# Patient Record
Sex: Female | Born: 1965 | Race: White | Hispanic: No | Marital: Married | Smoking: Current every day smoker
Health system: Southern US, Community
[De-identification: ages and names within clinical notes are randomized; demographics above are authoritative.]

## PROBLEM LIST (undated history)

## (undated) DIAGNOSIS — R011 Cardiac murmur, unspecified: Secondary | ICD-10-CM

## (undated) DIAGNOSIS — C50919 Malignant neoplasm of unspecified site of unspecified female breast: Secondary | ICD-10-CM

## (undated) DIAGNOSIS — Z923 Personal history of irradiation: Secondary | ICD-10-CM

## (undated) DIAGNOSIS — K219 Gastro-esophageal reflux disease without esophagitis: Secondary | ICD-10-CM

## (undated) DIAGNOSIS — I341 Nonrheumatic mitral (valve) prolapse: Secondary | ICD-10-CM

## (undated) DIAGNOSIS — I10 Essential (primary) hypertension: Secondary | ICD-10-CM

## (undated) HISTORY — PX: WISDOM TOOTH EXTRACTION: SHX21

## (undated) HISTORY — DX: Essential (primary) hypertension: I10

## (undated) HISTORY — DX: Malignant neoplasm of unspecified site of unspecified female breast: C50.919

---

## 1991-05-12 HISTORY — PX: TUBAL LIGATION: SHX77

## 2007-04-23 ENCOUNTER — Emergency Department (HOSPITAL_COMMUNITY): Admission: EM | Admit: 2007-04-23 | Discharge: 2007-04-23 | Payer: Self-pay | Admitting: *Deleted

## 2017-05-04 ENCOUNTER — Other Ambulatory Visit: Payer: Self-pay

## 2017-05-04 ENCOUNTER — Emergency Department (HOSPITAL_COMMUNITY): Payer: Worker's Compensation

## 2017-05-04 ENCOUNTER — Encounter (HOSPITAL_COMMUNITY): Payer: Self-pay | Admitting: Emergency Medicine

## 2017-05-04 ENCOUNTER — Emergency Department (HOSPITAL_COMMUNITY)
Admission: EM | Admit: 2017-05-04 | Discharge: 2017-05-04 | Disposition: A | Discharge status: Home / Self Care | Payer: Worker's Compensation | Attending: Emergency Medicine | Admitting: Emergency Medicine

## 2017-05-04 DIAGNOSIS — Z23 Encounter for immunization: Secondary | ICD-10-CM | POA: Insufficient documentation

## 2017-05-04 DIAGNOSIS — F17210 Nicotine dependence, cigarettes, uncomplicated: Secondary | ICD-10-CM | POA: Insufficient documentation

## 2017-05-04 DIAGNOSIS — W269XXA Contact with unspecified sharp object(s), initial encounter: Secondary | ICD-10-CM | POA: Diagnosis not present

## 2017-05-04 DIAGNOSIS — Y93G1 Activity, food preparation and clean up: Secondary | ICD-10-CM | POA: Diagnosis not present

## 2017-05-04 DIAGNOSIS — S61212A Laceration without foreign body of right middle finger without damage to nail, initial encounter: Secondary | ICD-10-CM | POA: Diagnosis present

## 2017-05-04 DIAGNOSIS — Y92090 Kitchen in other non-institutional residence as the place of occurrence of the external cause: Secondary | ICD-10-CM | POA: Diagnosis not present

## 2017-05-04 DIAGNOSIS — Y990 Civilian activity done for income or pay: Secondary | ICD-10-CM | POA: Diagnosis not present

## 2017-05-04 HISTORY — DX: Nonrheumatic mitral (valve) prolapse: I34.1

## 2017-05-04 MED ORDER — TETANUS-DIPHTH-ACELL PERTUSSIS 5-2.5-18.5 LF-MCG/0.5 IM SUSP
0.5000 mL | Freq: Once | INTRAMUSCULAR | Status: AC
  Administered 2017-05-04: 0.5 mL via INTRAMUSCULAR
  Filled 2017-05-04: qty 0.5

## 2017-05-04 NOTE — Discharge Instructions (Signed)
Please read instructions below.  Keep your wound clean and covered.  Gently daily with soap and water.  You can apply antibiotic ointment, such as bacitracin or Neosporin and re-bandage.  Make sure to protect your hand with a rubber glove prior to doing any dishes at work. Follow up with your primary care as needed. Return to the ER for fever, pus draining from wound, redness, or new or worsening symptoms.

## 2017-05-04 NOTE — ED Provider Notes (Signed)
[facility] Provider Note   CSN: [id number] Arrival date & time: 05/04/17  1532     History   Chief Complaint Chief Complaint  Patient presents with  . Finger Injury    HPI Brenda Waters is a 51 y.o. female presenting to the ED with acute onset of right middle finger laceration that occurred prior to arrival.  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  She states that she put a glove away the dishes slammed on her finger and caused a cut.  She states she initially she had trouble getting the bleeding to stop the so her work center here to the ED to be cleared.  Tetanus status is unknown.  Patient states she has minimal pain associated.  Is not on anticoagulation.  Denies any immunocompromise.  No other complaints.  The history is provided by the patient.    Past Medical History:  Diagnosis Date  . Mitral valve prolapse     There are no active problems to display for this patient.   Past Surgical History:  Procedure Laterality Date  . CESAREAN SECTION      OB History    No data available       Home Medications    Prior to Admission medications   Medication Sig Start Date End Date Taking? Authorizing Provider  acetaminophen (TYLENOL) 325 MG tablet Take 650 mg by mouth every 6 (six) hours as needed.   Yes [provider]    Family History No family history on file.  Social History Social History   Tobacco Use  . Smoking status: Current Every Day Smoker    Packs/day: 1.00    Years: 37.00    Pack years: 37.00    Types: Cigarettes  . Smokeless tobacco: Never Used  Substance Use Topics  . Alcohol use: Yes    Alcohol/week: 2.4 oz    Types: 4 Cans of beer per week  . Drug use: No     Allergies   Patient has no known allergies.   Review of Systems Review of Systems  Musculoskeletal: Negative for joint swelling.  Skin: Positive for wound.      Physical Exam Updated Vital Signs BP (!) 164/112   Pulse 89   Temp 98.3 F (36.8 C) (Oral)   Resp 20   Ht 5\' 3"  (1.6 m)   Wt 68 kg (150 lb)   LMP  (Within Months)   SpO2 96%   BMI 26.57 kg/m   Physical Exam  Constitutional: She appears well-developed and well-nourished.  HENT:  Head: Normocephalic and atraumatic.  Eyes: Conjunctivae are normal.  Cardiovascular: Intact distal pulses.  Pulmonary/Chest: Effort normal.  Musculoskeletal:  Palmar aspect of right third finger over distal phalanx with superficial skin flap.  Not actively bleeding.  Not grossly contaminated.  Entire digit with full active extension and flexion.  No bony tenderness.  Normal sensation.  Psychiatric: She has a normal mood and affect. Her behavior is normal.  Nursing note and vitals reviewed.    ED Treatments / Results  Labs (all labs ordered are listed, but only abnormal results are displayed) Labs Reviewed - No data to display  EKG  EKG Interpretation None       Radiology Dg Finger Middle Right  Result Date: 05/04/2017 CLINICAL DATA:  Right middle finger laceration. EXAM: RIGHT MIDDLE FINGER 2+V COMPARISON:  None. FINDINGS: There is no  evidence of fracture or dislocation. There is no evidence of arthropathy or other focal bone abnormality. Soft tissues are unremarkable. IMPRESSION: Normal right middle finger. Electronically Signed   By: Marijo Conception, M.D.   On: 05/04/2017 16:35    Procedures Procedures (including critical care time)  Medications Ordered in ED Medications  Tdap (BOOSTRIX) injection 0.5 mL (0.5 mLs Intramuscular Given 05/04/17 1943)     Initial Impression / Assessment and Plan / ED Course  I have reviewed the triage vital signs and the nursing notes.  Pertinent labs & imaging results that were available during my care of the patient were reviewed by me and considered in my medical decision making (see chart for details).  Clinical Course as of May 04 2024    Tue May 04, 2017  2010 Pt without Ha, vision changes, Cp, SOB, or any other symptoms. Discussed importance to follow up with PCP and recheck Bp. States she has BP cuff at home and can check it there. Strict return precautions discussed.  [JR]    Clinical Course User Index [JR] Malyiah Fellows, [place] N, PA-C    Patient with superficial wound to pad of distal right third finger, does not require closure.  Normal range of motion.  X-ray negative.  N/V intact.  Tetanus updated.  Wound irrigated, and wound care done in ED with bacitracin applied.  Discussed wound care at home.  Safe for discharge.  Discussed results, findings, treatment and follow up. Patient advised of return precautions. Patient verbalized understanding and agreed with plan.  Final Clinical Impressions(s) / ED Diagnoses   Final diagnoses:  Laceration of right middle finger without foreign body without damage to nail, initial encounter    ED Discharge Orders    None       Marletta Bousquet, [place] N, PA-C 05/04/17 1950    Mosiah Bastin, [place] N, PA-C 05/04/17 2025    Duffy Bruce, MD 05/05/17 [phone]

## 2017-05-04 NOTE — ED Triage Notes (Signed)
Pt was putting up a rack of dishes when her glove caught on the rack and and the rack landed on her finger between a stainless steel sink. Pt reports substantial amount of bleeding due to laceration. Bleeding controlled in triage.

## 2021-03-04 ENCOUNTER — Other Ambulatory Visit: Payer: Self-pay | Admitting: Family Medicine

## 2021-03-04 DIAGNOSIS — N6320 Unspecified lump in the left breast, unspecified quadrant: Secondary | ICD-10-CM

## 2021-03-27 ENCOUNTER — Ambulatory Visit
Admission: RE | Admit: 2021-03-27 | Discharge: 2021-03-27 | Disposition: A | Discharge status: Home / Self Care | Payer: No Typology Code available for payment source | Source: Ambulatory Visit | Attending: Family Medicine | Admitting: Family Medicine

## 2021-03-27 ENCOUNTER — Other Ambulatory Visit: Payer: Self-pay

## 2021-03-27 ENCOUNTER — Other Ambulatory Visit: Payer: Self-pay | Admitting: Family Medicine

## 2021-03-27 DIAGNOSIS — N6320 Unspecified lump in the left breast, unspecified quadrant: Secondary | ICD-10-CM

## 2021-03-27 DIAGNOSIS — R921 Mammographic calcification found on diagnostic imaging of breast: Secondary | ICD-10-CM

## 2021-04-11 ENCOUNTER — Ambulatory Visit
Admission: RE | Admit: 2021-04-11 | Discharge: 2021-04-11 | Disposition: A | Discharge status: Home / Self Care | Payer: No Typology Code available for payment source | Source: Ambulatory Visit | Attending: Family Medicine | Admitting: Family Medicine

## 2021-04-11 DIAGNOSIS — N6320 Unspecified lump in the left breast, unspecified quadrant: Secondary | ICD-10-CM

## 2021-04-11 DIAGNOSIS — R921 Mammographic calcification found on diagnostic imaging of breast: Secondary | ICD-10-CM

## 2021-04-15 ENCOUNTER — Telehealth: Payer: Self-pay | Admitting: Hematology

## 2021-04-15 NOTE — Telephone Encounter (Signed)
Spoke to patient to confirm morning clinic appointment for 12/14, packet mailed to patient

## 2021-04-18 ENCOUNTER — Encounter: Payer: Self-pay | Admitting: *Deleted

## 2021-04-21 ENCOUNTER — Other Ambulatory Visit: Payer: Self-pay | Admitting: *Deleted

## 2021-04-21 DIAGNOSIS — Z170 Estrogen receptor positive status [ER+]: Secondary | ICD-10-CM

## 2021-04-21 DIAGNOSIS — C50412 Malignant neoplasm of upper-outer quadrant of left female breast: Secondary | ICD-10-CM | POA: Insufficient documentation

## 2021-04-22 NOTE — Progress Notes (Signed)
Radiation Oncology         ([phone]) [phone] ________________________________  Multidisciplinary Breast Oncology Clinic [facility]) Initial Outpatient Consultation  Name: Brenda Waters MRN: [id number]  Date: 04/23/2021  DOB: Jan 05, 1966  MM:CRFVOHK, No Pcp Per (Inactive)  Stark Klein, MD   REFERRING PHYSICIAN: Stark Klein, MD  DIAGNOSIS: The encounter diagnosis was Malignant neoplasm of upper-outer quadrant of left breast in female, estrogen receptor positive ([place]).  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 Malignant neoplasm of upper-outer quadrant of left breast in female, estrogen receptor positive ([place])  C50.412    Z17.0       HISTORY OF PRESENT ILLNESS::Brenda Waters is a 55 y.o. female who is presenting to the office today for evaluation of her newly diagnosed breast cancer. She is accompanied by her husband. She is doing well overall.   The patient presented with a palpable lump in the left breast. Subsequently, she underwent bilateral diagnostic mammography with tomography and left breast ultrasonography at [facility] on 03/27/21 showing a highly suspicious mass in the left breast at 12 o'clock, 4 cm from the nipple. A tiny satellite lesion was also appreciated 2 mm from the dominant mass, as well as a group of calcifications with associated density laterally and inferiorly to the mass. Calcifications were also seen between the density in the dominant mass as well.   Left breast biopsy at the 12 o'clock position, 4 cmfn, on 04/11/21 showed: grade 2 invasive mammary carcinoma measuring 1.5 cm in the greatest linear extent, and mammary carcinoma in-situ. Left breast UOQ biopsy also performed revealed/confirmed invasive mammary carcinoma. Prognostic indicators significant for: estrogen receptor, 80% positive and progesterone receptor, 70% positive, both with strong staining intensity.  Proliferation marker Ki67 at 15%. HER2 negative.  Menarche: 55 years old Age at first live birth: 55 years old GP: 2 LMP: in 2017 Contraceptive: yes, for about 5 years though dates are unspecified HRT: none   The patient was referred today for presentation in the multidisciplinary conference.  Radiology studies and pathology slides were presented there for review and discussion of treatment options.  A consensus was discussed regarding potential next steps.  PREVIOUS RADIATION THERAPY: No  PAST MEDICAL HISTORY:  Past Medical History:  Diagnosis Date   Breast cancer ([place])    Hypertension    Mitral valve prolapse     PAST SURGICAL HISTORY: Past Surgical History:  Procedure Laterality Date   CESAREAN SECTION      FAMILY HISTORY:  Family History  Problem Relation Age of Onset   Cancer Father 43       Oral   Breast cancer Paternal Grandmother    Lung cancer Paternal Building services engineer   Colon cancer Other    Colon cancer Paternal Great-grandmother    Colon cancer Paternal Great-grandfather    Leukemia Half-Brother     SOCIAL HISTORY:  Social History   Socioeconomic History   Marital status: Married    Spouse name: Not on file   Number of children: 2   Years of education: Not on file   Highest education level: Not on file  Occupational History   Not on file  Tobacco Use   Smoking status: Every Day    Packs/day: 1.00    Years: 37.00    Pack years: 37.00    Types: Cigarettes   Smokeless  tobacco: Never  Substance and Sexual Activity   Alcohol use: Yes    Alcohol/week: 4.0 standard drinks    Types: 4 Cans of beer per week    Comment: 30 years   Drug use: No   Sexual activity: Not on file  Other Topics Concern   Not on file  Social History Narrative   Not on file   Social Determinants of Health   Financial Resource Strain: Not on file  Food Insecurity: No Food Insecurity   Worried About Running Out of Food in the Last Year: Never true   Ran Out  of Food in the Last Year: Never true  Transportation Needs: No Transportation Needs   Lack of Transportation (Medical): No   Lack of Transportation (Non-Medical): No  Physical Activity: Not on file  Stress: Not on file  Social Connections: Not on file    ALLERGIES: No Known Allergies  MEDICATIONS:  Current Outpatient Medications  Medication Sig Dispense Refill   acetaminophen (TYLENOL) 325 MG tablet Take 650 mg by mouth every 6 (six) hours as needed.     Apple Cider Vinegar 500 MG TABS Take 450 mg by mouth.     ciclopirox (PENLAC) 8 % solution Apply 1 drop topically daily.     losartan-hydrochlorothiazide (HYZAAR) 50-12.5 MG tablet Take 1 tablet by mouth daily.     Multiple Vitamin (MULTIVITAMIN WITH MINERALS) TABS tablet Take 1 tablet by mouth daily.     No current facility-administered medications for this encounter.    REVIEW OF SYSTEMS: A 10+ POINT REVIEW OF SYSTEMS WAS OBTAINED including neurology, dermatology, psychiatry, cardiac, respiratory, lymph, extremities, GI, GU, musculoskeletal, constitutional, reproductive, HEENT. On the provided form, she reports a palpable breast lump. She denies any other symptoms.  She denies any pain within the breast area nipple discharge or bleeding prior to diagnosis.   PHYSICAL EXAM:    Vitals with BMI 04/23/2021  Height _0   Weight 131 lbs 3 oz  BMI 66.44  Systolic 034  Diastolic 77  Pulse 70    Lungs are clear to auscultation bilaterally. Heart has regular rate and rhythm. No palpable cervical, supraclavicular, or axillary adenopathy. Abdomen soft, non-tender, normal bowel sounds. Breast: Right breast with no palpable mass, nipple discharge, or bleeding. Left breast with a large palpable mass in the upper aspect of the breast measuring 4.5 cm in  greatest dimension. Some slight nipple retraction noted. When lying down, the mass protrudes above the breast tissue. No nipple discharge or bleeding. No skin involvement  KPS = 90  100 -  Normal; no complaints; no evidence of disease. 90   - Able to carry on normal activity; minor signs or symptoms of disease. 80   - Normal activity with effort; some signs or symptoms of disease. 4   - Cares for self; unable to carry on normal activity or to do active work. 60   - Requires occasional assistance, but is able to care for most of his personal needs. 50   - Requires considerable assistance and frequent medical care. 64   - Disabled; requires special care and assistance. 12   - Severely disabled; hospital admission is indicated although death not imminent. 53   - Very sick; hospital admission necessary; active supportive treatment necessary. 10   - Moribund; fatal processes progressing rapidly. 0     - Dead  Karnofsky DA, Abelmann WH, Craver LS and Burchenal [facility] [phone]) The use of the nitrogen mustards in the palliative treatment of carcinoma:  with particular reference to bronchogenic carcinoma Cancer 1 634-56  LABORATORY DATA:  Lab Results  Component Value Date   WBC 8.3 04/23/2021   HGB 14.4 04/23/2021   HCT 42.3 04/23/2021   MCV 89.8 04/23/2021   PLT 393 04/23/2021   Lab Results  Component Value Date   NA 143 04/23/2021   K 3.7 04/23/2021   CL 107 04/23/2021   CO2 26 04/23/2021   Lab Results  Component Value Date   ALT 22 04/23/2021   AST 17 04/23/2021   ALKPHOS 57 04/23/2021   BILITOT 0.9 04/23/2021    PULMONARY FUNCTION TEST:   Recent Review Flowsheet Data   There is no flowsheet data to display.     RADIOGRAPHY: US BREAST LTD UNI LEFT INC AXILLA  Result Date: 03/27/2021 CLINICAL DATA:  The patient presents with a palpable lump in the left breast. EXAM: DIGITAL DIAGNOSTIC BILATERAL MAMMOGRAM WITH TOMOSYNTHESIS AND CAD; ULTRASOUND LEFT BREAST LIMITED TECHNIQUE: Bilateral digital diagnostic mammography and breast tomosynthesis was performed. The images were evaluated with computer-aided detection.; Targeted ultrasound examination of the left breast was  performed. COMPARISON:  None. ACR Breast Density Category b: There are scattered areas of fibroglandular density. FINDINGS: No suspicious masses, calcifications, or distortion are identified in the right breast. There is a spiculated mass containing calcifications in the superior left breast measuring 3.4 cm mammographically. Just inferior and lateral to this mass is a group of calcifications with associated density. There are calcifications between the mass and the increased density as well. Including the calcifications, the mass, and the density, the total size is 4.4 cm. On physical exam, there is a lump in the left breast. Targeted ultrasound is performed, showing a dominant mass in the left breast at 12 o'clock, 4 cm from the nipple measuring 3.9 x 2.9 x 1.2 cm. There is an adjacent satellite lesion at 1 o'clock, 4 cm from the nipple measuring 7 x 8 x 6 mm. The dominant mass and the satellite lesion are only 2 mm apart and do not need to be biopsied separately. No adenopathy identified. The axillary image demonstrates a normal node with an adjacent vessel. IMPRESSION: Highly suspicious mass in the left breast at 12 o'clock, 4 cm from the nipple. Tiny satellite lesion 2 mm from the dominant mass. There is a group of calcifications with associated density lateral and inferior to the mass. There are calcifications between the density in the dominant mass as well. RECOMMENDATION: Recommend ultrasound-guided biopsy of the 12 o'clock left breast mass. Recommend stereotactic biopsy of the density containing calcifications just inferior and lateral to the dominant mass. I have discussed the findings and recommendations with the patient. If applicable, a reminder letter will be sent to the patient regarding the next appointment. BI-RADS CATEGORY  5: Highly suggestive of malignancy. Electronically Signed   By: Dorise Bullion III M.D.   On: 03/27/2021 12:24  MM DIAG BREAST TOMO BILATERAL  Result Date:  03/27/2021 CLINICAL DATA:  The patient presents with a palpable lump in the left breast. EXAM: DIGITAL DIAGNOSTIC BILATERAL MAMMOGRAM WITH TOMOSYNTHESIS AND CAD; ULTRASOUND LEFT BREAST LIMITED TECHNIQUE: Bilateral digital diagnostic mammography and breast tomosynthesis was performed. The images were evaluated with computer-aided detection.; Targeted ultrasound examination of the left breast was performed. COMPARISON:  None. ACR Breast Density Category b: There are scattered areas of fibroglandular density. FINDINGS: No suspicious masses, calcifications, or distortion are identified in the right breast. There is a spiculated mass containing calcifications in the superior left breast  measuring 3.4 cm mammographically. Just inferior and lateral to this mass is a group of calcifications with associated density. There are calcifications between the mass and the increased density as well. Including the calcifications, the mass, and the density, the total size is 4.4 cm. On physical exam, there is a lump in the left breast. Targeted ultrasound is performed, showing a dominant mass in the left breast at 12 o'clock, 4 cm from the nipple measuring 3.9 x 2.9 x 1.2 cm. There is an adjacent satellite lesion at 1 o'clock, 4 cm from the nipple measuring 7 x 8 x 6 mm. The dominant mass and the satellite lesion are only 2 mm apart and do not need to be biopsied separately. No adenopathy identified. The axillary image demonstrates a normal node with an adjacent vessel. IMPRESSION: Highly suspicious mass in the left breast at 12 o'clock, 4 cm from the nipple. Tiny satellite lesion 2 mm from the dominant mass. There is a group of calcifications with associated density lateral and inferior to the mass. There are calcifications between the density in the dominant mass as well. RECOMMENDATION: Recommend ultrasound-guided biopsy of the 12 o'clock left breast mass. Recommend stereotactic biopsy of the density containing calcifications just  inferior and lateral to the dominant mass. I have discussed the findings and recommendations with the patient. If applicable, a reminder letter will be sent to the patient regarding the next appointment. BI-RADS CATEGORY  5: Highly suggestive of malignancy. Electronically Signed   By: Dorise Bullion III M.D.   On: 03/27/2021 12:24  MM CLIP PLACEMENT LEFT  Result Date: 04/11/2021 CLINICAL DATA:  Evaluate post biopsy marker clip placements following ultrasound-guided and stereotactic core needle biopsies of the left breast. EXAM: 3D DIAGNOSTIC LEFT MAMMOGRAM POST ULTRASOUND AND STEREOTACTIC BIOPSIES COMPARISON:  Previous exam(s). FINDINGS: 3D Mammographic images were obtained following ultrasound guided and stereotactic guided biopsies of the left breast. The ribbon shaped biopsy clip lies within the upper breast mass. The X shaped biopsy clip lies 2.2 cm lateral to the mass, in the area of the asymmetry, with a few adjacent residual calcifications. IMPRESSION: Appropriate positioning of the ribbon shaped and X shaped biopsy marking clip at the site of biopsy in the upper left breast mass and adjacent asymmetry respectively. Final Assessment: Post Procedure Mammograms for Marker Placement Electronically Signed   By: Lajean Manes M.D.   On: 04/11/2021 09:17  MM LT BREAST BX W LOC DEV 1ST LESION IMAGE BX SPEC STEREO GUIDE  Addendum Date: 04/15/2021   ADDENDUM REPORT: 04/15/2021 16:03 ADDENDUM: 1- Pathology revealed GRADE II INVASIVE MAMMARY CARCINOMA- MAMMARY CARCINOMA IN SITU of the LEFT breast, 12 o'clock, 4cmfn (ribbon clip). This was found to be concordant by Dr. Lajean Manes. 2- Pathology revealed GRADE II INVASIVE MAMMARY CARCINOMA of the LEFT breast, upper outer quadrant (X clip). This was found to be concordant by Dr. Lajean Manes. Note: 1. and 2. E-cadherin is NEGATIVE supporting lobular origin. 2. There are microcalcifications associated with benign breast tissue (invasive carcinoma) in the biopsy  material. Pathology results were discussed with the patient by telephone. The patient reported doing well after the biopsies with tenderness at the sites. Post biopsy instructions and care were reviewed and questions were answered. The patient was encouraged to call [facility] for any additional concerns. The patient was referred to [facility] at [facility] on April 23, 2021. Pathology results reported by Stacie Acres RN on 04/15/2021.  Electronically Signed   By: Lajean Manes M.D.   On: 04/15/2021 16:03   Result Date: 04/15/2021 CLINICAL DATA:  Patient presents for ultrasound-guided core needle biopsy of a 3.9 cm highly suspicious left breast mass and stereotactic core needle biopsy an adjacent asymmetry with associated calcifications. EXAM: ULTRASOUND GUIDED LEFT BREAST CORE NEEDLE BIOPSY: BIOPSY #1. STEREOTACTIC GUIDED LEFT BREAST CORE NEEDLE BIOPSY: BIOPSY #2. COMPARISON:  Previous exam(s). PROCEDURE: BIOPSY #1: 3.9 CM 12 O'CLOCK POSITION BREAST MASS. I met with the patient and we discussed the procedure of ultrasound-guided biopsy, including benefits and alternatives. We discussed the high likelihood of a successful procedure. We discussed the risks of the procedure, including infection, bleeding, tissue injury, clip migration, and inadequate sampling. Informed written consent was given. The usual time-out protocol was performed immediately prior to the procedure. Lesion quadrant: Upper outer quadrant, near 12 o'clock, 4 cm the nipple. Using sterile technique and 1% Lidocaine as local anesthetic, under direct ultrasound visualization, a 12 gauge spring-loaded device was used to perform biopsy of the 3.9 cm spiculated mass using an inferior approach. At the conclusion of the procedure a ribbon shaped tissue marker clip was deployed into the biopsy cavity. BIOPSY #2: 1.3 CM SPAN CALCIFICATIONS AND ASSOCIATED ASYMMETRY.  The patient and I discussed the procedure of stereotactic-guided biopsy including benefits and alternatives. We discussed the high likelihood of a successful procedure. We discussed the risks of the procedure including infection, bleeding, tissue injury, clip migration, and inadequate sampling. Informed written consent was given. The usual time out protocol was performed immediately prior to the procedure. Using sterile technique and 1% Lidocaine as local anesthetic, under stereotactic guidance, a 9 gauge vacuum assisted device was used to perform core needle biopsy of calcifications and associated asymmetry lying 2 cm lateral and slightly inferior to the 12 o'clock position mass, using a superior approach. Specimen radiograph was performed showing several calcifications for which biopsy was performed. Specimens with calcifications are identified for pathology. Lesion quadrant: Upper outer quadrant At the conclusion of the procedure, an X shaped tissue marker clip was deployed into the biopsy cavity. Follow up 2 view mammogram was performed and dictated separately. IMPRESSION: Ultrasound guided biopsy of a left breast mass. Stereotactic guided biopsy left breast calcifications and asymmetry 2 cm lateral and slightly inferior to the left breast mass. No apparent complications. Electronically Signed: By: Lajean Manes M.D. On: 04/11/2021 09:12  [place] LT BREAST BX W LOC DEV 1ST LESION IMG BX SPEC US GUIDE  Addendum Date: 04/15/2021   ADDENDUM REPORT: 04/15/2021 16:03 ADDENDUM: 1- Pathology revealed GRADE II INVASIVE MAMMARY CARCINOMA- MAMMARY CARCINOMA IN SITU of the LEFT breast, 12 o'clock, 4cmfn (ribbon clip). This was found to be concordant by Dr. Lajean Manes. 2- Pathology revealed GRADE II INVASIVE MAMMARY CARCINOMA of the LEFT breast, upper outer quadrant (X clip). This was found to be concordant by Dr. Lajean Manes. Note: 1. and 2. E-cadherin is NEGATIVE supporting lobular origin. 2. There are  microcalcifications associated with benign breast tissue (invasive carcinoma) in the biopsy material. Pathology results were discussed with the patient by telephone. The patient reported doing well after the biopsies with tenderness at the sites. Post biopsy instructions and care were reviewed and questions were answered. The patient was encouraged to call [facility] for any additional concerns. The patient was referred to [facility] on April 23, 2021. Pathology results reported by Stacie Acres RN on 04/15/2021.  Electronically Signed   By: Lajean Manes M.D.   On: 04/15/2021 16:03   Result Date: 04/15/2021 CLINICAL DATA:  Patient presents for ultrasound-guided core needle biopsy of a 3.9 cm highly suspicious left breast mass and stereotactic core needle biopsy an adjacent asymmetry with associated calcifications. EXAM: ULTRASOUND GUIDED LEFT BREAST CORE NEEDLE BIOPSY: BIOPSY #1. STEREOTACTIC GUIDED LEFT BREAST CORE NEEDLE BIOPSY: BIOPSY #2. COMPARISON:  Previous exam(s). PROCEDURE: BIOPSY #1: 3.9 CM 12 O'CLOCK POSITION BREAST MASS. I met with the patient and we discussed the procedure of ultrasound-guided biopsy, including benefits and alternatives. We discussed the high likelihood of a successful procedure. We discussed the risks of the procedure, including infection, bleeding, tissue injury, clip migration, and inadequate sampling. Informed written consent was given. The usual time-out protocol was performed immediately prior to the procedure. Lesion quadrant: Upper outer quadrant, near 12 o'clock, 4 cm the nipple. Using sterile technique and 1% Lidocaine as local anesthetic, under direct ultrasound visualization, a 12 gauge spring-loaded device was used to perform biopsy of the 3.9 cm spiculated mass using an inferior approach. At the conclusion of the procedure a ribbon shaped tissue marker clip was  deployed into the biopsy cavity. BIOPSY #2: 1.3 CM SPAN CALCIFICATIONS AND ASSOCIATED ASYMMETRY. The patient and I discussed the procedure of stereotactic-guided biopsy including benefits and alternatives. We discussed the high likelihood of a successful procedure. We discussed the risks of the procedure including infection, bleeding, tissue injury, clip migration, and inadequate sampling. Informed written consent was given. The usual time out protocol was performed immediately prior to the procedure. Using sterile technique and 1% Lidocaine as local anesthetic, under stereotactic guidance, a 9 gauge vacuum assisted device was used to perform core needle biopsy of calcifications and associated asymmetry lying 2 cm lateral and slightly inferior to the 12 o'clock position mass, using a superior approach. Specimen radiograph was performed showing several calcifications for which biopsy was performed. Specimens with calcifications are identified for pathology. Lesion quadrant: Upper outer quadrant At the conclusion of the procedure, an X shaped tissue marker clip was deployed into the biopsy cavity. Follow up 2 view mammogram was performed and dictated separately. IMPRESSION: Ultrasound guided biopsy of a left breast mass. Stereotactic guided biopsy left breast calcifications and asymmetry 2 cm lateral and slightly inferior to the left breast mass. No apparent complications. Electronically Signed: By: Lajean Manes M.D. 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  She does have a large palpable mass in the breast, and with lumpectomy she may not have a good cosmetic result. We discussed the general course of radiation, potential side effects, and toxicities with radiation and the patient is interested in this  approach.  We also discussed indications for postmastectomy radiation therapy.  PLAN:  MRI  Plastic surgery consultation in the event of mastectomy Left lumpectomy with SLN (pending MRI) Oncotype  Adjuvant radiation therapy Aromatase Inhibitor    ------------------------------------------------  Blair Promise, PhD, MD  This document serves as a record of services personally performed by Gery Pray, MD. It was created on his behalf by Roney Mans, a trained medical scribe. The creation of this record is based on the scribe's personal observations and the provider's statements to them. This document has been checked and approved by the attending provider.

## 2021-04-22 NOTE — Progress Notes (Signed)
[place]   [phone] Fax:([phone] [place] Note   Patient Care Team: Patient, No Pcp Per (Inactive) as PCP - General (General Practice) Rockwell Germany, RN as Oncology Nurse Navigator Mauro Kaufmann, RN as Oncology Nurse Navigator Stark Klein, MD as Consulting Physician (General Surgery) Truitt Merle, MD as Consulting Physician (Hematology) Gery Pray, MD as Consulting Physician (Radiation Oncology)  Date of Service:  04/23/2021   CHIEF COMPLAINTS/PURPOSE OF CONSULTATION:  Left Breast Cancer, ER+  REFERRING PHYSICIAN:  [facility]   ASSESSMENT & PLAN:  Brenda Waters is a 55 y.o. postmenopausal female with a history of   1. 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 B/l diagnostic MM and left [place] on 03/27/21 showed 3.9 cm dominant mass at 12 o'clock and 8 mm adjacent satellite lesion at 1 o'clock, with associated calcifications. Biopsy 04/11/21 revealed invasive and in situ lobular carcinoma. --We discussed her imaging findings and the biopsy results in great details. -she will have breast MRI o evaluate the extent of her disease due to lobular histology, and see if she is a candidate for lumpectomy. She is agreeable with that. She was seen by Dr. Barry Dienes today and likely will proceed with surgery soon. She will likely need mastectomy -I recommend a Oncotype Dx test on the surgical sample and we'll make a decision about adjuvant chemotherapy based on the Oncotype result. Written material of this test was given to her. She is young and fit, would be a good candidate for chemotherapy if her Oncotype recurrence score is high. -If her surgical sentinel lymph node is positive, I recommend mammaprint for further risk stratification and guide adjuvant chemotherapy. -Giving the strong ER and PR  expression in her postmenopausal status, I recommend adjuvant endocrine therapy with aromatase inhibitor for a total of 10 years due to her lobular histology to reduce the risk of cancer recurrence. Potential benefits and side effects were discussed with patient and she is interested. -She was also seen by radiation oncologist Dr. Sondra Come today.  -She will likely benefit from breast radiation if she undergo lumpectomy to decrease the risk of breast cancer.  She was seen by Dr. Sondra Come today. -We also discussed the breast cancer surveillance after her surgery. She will continue annual screening mammogram, self exam, and a routine office visit with lab and exam with [place]. -I encouraged her to have healthy diet and exercise regularly.   2. Bone Health  -She has never had a DEXA. We will obtain one for baseline.  3. Social Support, lack of insurance  -she currently does not have insurance, notes she can't afford the insurance her job offers. We will have our social worker meet with her. -she continues to smoke and does not seem interested in quitting at this time. I recommended she try to at least cut back.   PLAN:  -breast MRI to evaluate the extent of her disease due to lobular histology -she will proceed with breast surgery soon, likely mastectomy and sentinel lymph node biopsy -Oncotype on her surgical sample, or MammaPrint if positive lymph node -I will see her after surgery or radiation.   Oncology History Overview Note   Cancer Staging  Malignant neoplasm of upper-outer quadrant of left breast in female, estrogen receptor positive ([place]) Staging form: Breast, AJCC 8th Edition - Clinical stage from 04/11/2021: Stage IB (cT2,  cN0, cM0, G2, ER+, PR+, HER2-) - Signed by Truitt Merle, MD on 04/22/2021    Malignant neoplasm of upper-outer quadrant of left breast in female, estrogen receptor positive ([place])  03/27/2021 Mammogram   EXAM: DIGITAL DIAGNOSTIC BILATERAL MAMMOGRAM WITH TOMOSYNTHESIS AND  CAD; ULTRASOUND LEFT BREAST LIMITED  IMPRESSION: Highly suspicious mass in the left breast at 12 o'clock, 4 cm from the nipple. Tiny satellite lesion 2 mm from the dominant mass. There is a group of calcifications with associated density lateral and inferior to the mass. There are calcifications between the density in the dominant mass as well.   04/11/2021 Cancer Staging   Staging form: Breast, AJCC 8th Edition - Clinical stage from 04/11/2021: Stage IB (cT2, cN0, cM0, G2, ER+, PR+, HER2-) - Signed by Truitt Merle, MD on 04/22/2021 Stage prefix: Initial diagnosis Histologic grading system: 3 grade system    04/11/2021 Initial Biopsy   Diagnosis 1. Breast, left, needle core biopsy, 12 o'clock, 4cmfn - INVASIVE MAMMARY CARCINOMA - MAMMARY CARCINOMA IN SITU - SEE COMMENT 2. Breast, left, needle core biopsy, upper outer quadrant - INVASIVE MAMMARY CARCINOMA - SEE COMMENT Microscopic Comment 1. and 2. The biopsy material shows an infiltrative proliferation of cells arranged linearly and in small clusters. Based on the biopsy, the carcinoma appears Nottingham grade 2 of 3 and measures 1.5 cm in greatest linear extent.  2. There are microcalcifications associated with benign breast tissue (invasive carcinoma) in the biopsy material.  1. and 2. E-cadherin is NEGATIVE supporting lobular origin.  1. PROGNOSTIC INDICATORS Results: The tumor cells are EQUIVOCAL for Her2 (2+). Her2 by FISH will be performed and results reported separately. Estrogen Receptor: 80%, POSITIVE, STRONG STAINING INTENSITY Progesterone Receptor: 70%, POSITIVE, STRONG STAINING INTENSITY Proliferation Marker Ki67: 15%  1. FLUORESCENCE IN-SITU HYBRIDIZATION Results: GROUP 5: HER2 **NEGATIVE**   04/21/2021 Initial Diagnosis   Malignant neoplasm of upper-outer quadrant of left breast in female, estrogen receptor positive ([place])      HISTORY OF PRESENTING ILLNESS:  Brenda Waters 55 y.o. female is a here because of  breast cancer. The patient was [facility] by [facility]. The patient presents to the clinic today accompanied by her husband Brenda Waters.   She presented with a palpable left breast lump, initially noticed in summer. She reports it doubled in size, prompting her to seek medical attention. She underwent bilateral diagnostic mammography and left breast ultrasonography on 03/27/21 showing: palpable 3.9 cm mass at 12 o'clock; 8 mm adjacent satellite lesion 2 mm from dominant mass; calcifications lateral and inferior to and within dominant mass; no adenopathy.  Biopsy on 04/11/21 showed: invasive and in situ mammary carcinoma, e-cadherin negative, grade 2. Invasive component present in both samples. Prognostic indicators significant for: estrogen receptor, 80% positive and progesterone receptor, 70% positive. Proliferation marker Ki67 at 15%. HER2 negative by FISH.   Today the patient notes they felt/feeling prior/after... -she denies any new pains or changes.  She has a PMHx of.... -HTN, on medication starting 02/2021  Socially... 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 -she reports cancer on her father's side-- breast cancer in her grandmother (received chemo), oral cancer in her father, colon cancer in a great aunt and two great-grandparents. -she is currently smoking a pack a day, for 37 years -she drinks 4 beers a week, "for my whole life"  GYN HISTORY  Menarchal: 55 years old LMP: ~55 years old Contraceptive: HRT:  GP:  2, first at age 89   REVIEW OF SYSTEMS:    Constitutional: Denies fevers, chills or abnormal night sweats Eyes: Denies blurriness of vision, double vision or watery eyes Ears, nose, mouth, throat, and face: Denies mucositis or sore throat Respiratory: Denies cough, dyspnea or wheezes Cardiovascular: Denies palpitation, chest discomfort or lower extremity swelling Gastrointestinal:  Denies nausea, heartburn or  change in bowel habits Skin: Denies abnormal skin rashes Lymphatics: Denies new lymphadenopathy or easy bruising Neurological:Denies numbness, tingling or new weaknesses Behavioral/Psych: Mood is stable, no new changes  All other systems were reviewed with the patient and are negative.   MEDICAL HISTORY:  Past Medical History:  Diagnosis Date   Breast cancer ([place])    Hypertension    Mitral valve prolapse     SURGICAL HISTORY: Past Surgical History:  Procedure Laterality Date   CESAREAN SECTION      SOCIAL HISTORY: Social History   Socioeconomic History   Marital status: Married    Spouse name: Not on file   Number of children: 2   Years of education: Not on file   Highest education level: Not on file  Occupational History   Not on file  Tobacco Use   Smoking status: Every Day    Packs/day: 1.00    Years: 37.00    Pack years: 37.00    Types: Cigarettes   Smokeless tobacco: Never  Substance and Sexual Activity   Alcohol use: Yes    Alcohol/week: 4.0 standard drinks    Types: 4 Cans of beer per week    Comment: 30 years   Drug use: No   Sexual activity: Not on file  Other Topics Concern   Not on file  Social History Narrative   Not on file   Social Determinants of Health   Financial Resource Strain: Not on file  Food Insecurity: No Food Insecurity   Worried About Running Out of Food in the Last Year: Never true   Ran Out of Food in the Last Year: Never true  Transportation Needs: No Transportation Needs   Lack of Transportation (Medical): No   Lack of Transportation (Non-Medical): No  Physical Activity: Not on file  Stress: Not on file  Social Connections: Not on file  Intimate Partner Violence: Not on file    FAMILY HISTORY: Family History  Problem Relation Age of Onset   Cancer Father 36       Oral   Breast cancer Paternal Grandmother    Lung cancer Paternal Uncle        firefighter   Colon cancer Other    Colon cancer Paternal  Great-grandmother    Colon cancer Paternal Great-grandfather    Leukemia Half-Brother     ALLERGIES:  has No Known Allergies.  MEDICATIONS:  Current Outpatient Medications  Medication Sig Dispense Refill   acetaminophen (TYLENOL) 325 MG tablet Take 650 mg by mouth every 6 (six) hours as needed.     Apple Cider Vinegar 500 MG TABS Take 450 mg by mouth.     ciclopirox (PENLAC) 8 % solution Apply 1 drop topically daily.     losartan-hydrochlorothiazide (HYZAAR) 50-12.5 MG tablet Take 1 tablet by mouth daily.     Multiple Vitamin (MULTIVITAMIN WITH MINERALS) TABS tablet Take 1 tablet by mouth daily.     No current facility-administered medications for this visit.    PHYSICAL EXAMINATION: ECOG PERFORMANCE STATUS: 0 - Asymptomatic  Vitals:   04/23/21 0917  BP: 134/77  Pulse: 70  Resp: 18  Temp: 97.9 F (36.6 C)  SpO2: 99%   Filed Weights   04/23/21 0917  Weight: 131 lb 3.2 oz (59.5 kg)    GENERAL:alert, no distress and comfortable SKIN: skin color, texture, turgor are normal, no rashes or significant lesions EYES: normal, Conjunctiva are pink and non-injected, sclera clear  NECK: supple, thyroid normal size, non-tender, without nodularity LYMPH:  no palpable lymphadenopathy in the cervical, axillary LUNGS: clear to auscultation and percussion with normal breathing effort HEART: regular rate & rhythm and no murmurs and no lower extremity edema ABDOMEN:abdomen soft, non-tender and normal bowel sounds Musculoskeletal:no cyanosis of digits and no clubbing  NEURO: alert & oriented x 3 with fluent speech, no focal motor/sensory deficits BREAST: 5 x 4 cm left breast mass. No palpable adenopathy bilaterally.   LABORATORY DATA:  I have reviewed the data as listed CBC Latest Ref Rng & Units 04/23/2021  WBC 4.0 - 10.5 K/uL 8.3  Hemoglobin 12.0 - 15.0 g/dL 14.4  Hematocrit 36.0 - 46.0 % 42.3  Platelets 150 - 400 K/uL 393    CMP Latest Ref Rng & Units 04/23/2021  Glucose 70 -  99 mg/dL 73  BUN 6 - 20 mg/dL 12  Creatinine 0.44 - 1.00 mg/dL 0.88  Sodium 135 - 145 mmol/L 143  Potassium 3.5 - 5.1 mmol/L 3.7  Chloride 98 - 111 mmol/L 107  CO2 22 - 32 mmol/L 26  Calcium 8.9 - 10.3 mg/dL 9.5  Total Protein 6.5 - 8.1 g/dL 7.2  Total Bilirubin 0.3 - 1.2 mg/dL 0.9  Alkaline Phos 38 - 126 U/L 57  AST 15 - 41 U/L 17  ALT 0 - 44 U/L 22     RADIOGRAPHIC STUDIES: I have personally reviewed the radiological images as listed and agreed with the findings in the report. US BREAST LTD UNI LEFT INC AXILLA  Result Date: 03/27/2021 CLINICAL DATA:  The patient presents with a palpable lump in the left breast. EXAM: DIGITAL DIAGNOSTIC BILATERAL MAMMOGRAM WITH TOMOSYNTHESIS AND CAD; ULTRASOUND LEFT BREAST LIMITED TECHNIQUE: Bilateral digital diagnostic mammography and breast tomosynthesis was performed. The images were evaluated with computer-aided detection.; Targeted ultrasound examination of the left breast was performed. COMPARISON:  None. ACR Breast Density Category b: There are scattered areas of fibroglandular density. FINDINGS: No suspicious masses, calcifications, or distortion are identified in the right breast. There is a spiculated mass containing calcifications in the superior left breast measuring 3.4 cm mammographically. Just inferior and lateral to this mass is a group of calcifications with associated density. There are calcifications between the mass and the increased density as well. Including the calcifications, the mass, and the density, the total size is 4.4 cm. On physical exam, there is a lump in the left breast. Targeted ultrasound is performed, showing a dominant mass in the left breast at 12 o'clock, 4 cm from the nipple measuring 3.9 x 2.9 x 1.2 cm. There is an adjacent satellite lesion at 1 o'clock, 4 cm from the nipple measuring 7 x 8 x 6 mm. The dominant mass and the satellite lesion are only 2 mm apart and do not need to be biopsied separately. No adenopathy  identified. The axillary image demonstrates a normal node with an adjacent vessel. IMPRESSION: Highly suspicious mass in the left breast at 12 o'clock, 4 cm from the nipple. Tiny satellite lesion 2 mm from the dominant mass. There is a group of calcifications with associated density lateral and inferior to the mass. There are calcifications between the density in the  dominant mass as well. RECOMMENDATION: Recommend ultrasound-guided biopsy of the 12 o'clock left breast mass. Recommend stereotactic biopsy of the density containing calcifications just inferior and lateral to the dominant mass. I have discussed the findings and recommendations with the patient. If applicable, a reminder letter will be sent to the patient regarding the next appointment. BI-RADS CATEGORY  5: Highly suggestive of malignancy. Electronically Signed   By: Dorise Bullion III M.D.   On: 03/27/2021 12:24  MM DIAG BREAST TOMO BILATERAL  Result Date: 03/27/2021 CLINICAL DATA:  The patient presents with a palpable lump in the left breast. EXAM: DIGITAL DIAGNOSTIC BILATERAL MAMMOGRAM WITH TOMOSYNTHESIS AND CAD; ULTRASOUND LEFT BREAST LIMITED TECHNIQUE: Bilateral digital diagnostic mammography and breast tomosynthesis was performed. The images were evaluated with computer-aided detection.; Targeted ultrasound examination of the left breast was performed. COMPARISON:  None. ACR Breast Density Category b: There are scattered areas of fibroglandular density. FINDINGS: No suspicious masses, calcifications, or distortion are identified in the right breast. There is a spiculated mass containing calcifications in the superior left breast measuring 3.4 cm mammographically. Just inferior and lateral to this mass is a group of calcifications with associated density. There are calcifications between the mass and the increased density as well. Including the calcifications, the mass, and the density, the total size is 4.4 cm. On physical exam, there is a  lump in the left breast. Targeted ultrasound is performed, showing a dominant mass in the left breast at 12 o'clock, 4 cm from the nipple measuring 3.9 x 2.9 x 1.2 cm. There is an adjacent satellite lesion at 1 o'clock, 4 cm from the nipple measuring 7 x 8 x 6 mm. The dominant mass and the satellite lesion are only 2 mm apart and do not need to be biopsied separately. No adenopathy identified. The axillary image demonstrates a normal node with an adjacent vessel. IMPRESSION: Highly suspicious mass in the left breast at 12 o'clock, 4 cm from the nipple. Tiny satellite lesion 2 mm from the dominant mass. There is a group of calcifications with associated density lateral and inferior to the mass. There are calcifications between the density in the dominant mass as well. RECOMMENDATION: Recommend ultrasound-guided biopsy of the 12 o'clock left breast mass. Recommend stereotactic biopsy of the density containing calcifications just inferior and lateral to the dominant mass. I have discussed the findings and recommendations with the patient. If applicable, a reminder letter will be sent to the patient regarding the next appointment. BI-RADS CATEGORY  5: Highly suggestive of malignancy. Electronically Signed   By: Dorise Bullion III M.D.   On: 03/27/2021 12:24  MM CLIP PLACEMENT LEFT  Result Date: 04/11/2021 CLINICAL DATA:  Evaluate post biopsy marker clip placements following ultrasound-guided and stereotactic core needle biopsies of the left breast. EXAM: 3D DIAGNOSTIC LEFT MAMMOGRAM POST ULTRASOUND AND STEREOTACTIC BIOPSIES COMPARISON:  Previous exam(s). FINDINGS: 3D Mammographic images were obtained following ultrasound guided and stereotactic guided biopsies of the left breast. The ribbon shaped biopsy clip lies within the upper breast mass. The X shaped biopsy clip lies 2.2 cm lateral to the mass, in the area of the asymmetry, with a few adjacent residual calcifications. IMPRESSION: Appropriate positioning of  the ribbon shaped and X shaped biopsy marking clip at the site of biopsy in the upper left breast mass and adjacent asymmetry respectively. Final Assessment: Post Procedure Mammograms for Marker Placement Electronically Signed   By: Lajean Manes M.D.   On: 04/11/2021 09:17  MM LT BREAST BX W  LOC DEV 1ST LESION IMAGE BX SPEC STEREO GUIDE  Addendum Date: 04/15/2021   ADDENDUM REPORT: 04/15/2021 16:03 ADDENDUM: 1- Pathology revealed GRADE II INVASIVE MAMMARY CARCINOMA- MAMMARY CARCINOMA IN SITU of the LEFT breast, 12 o'clock, 4cmfn (ribbon clip). This was found to be concordant by Dr. Lajean Manes. 2- Pathology revealed GRADE II INVASIVE MAMMARY CARCINOMA of the LEFT breast, upper outer quadrant (X clip). This was found to be concordant by Dr. Lajean Manes. Note: 1. and 2. E-cadherin is NEGATIVE supporting lobular origin. 2. There are microcalcifications associated with benign breast tissue (invasive carcinoma) in the biopsy material. Pathology results were discussed with the patient by telephone. The patient reported doing well after the biopsies with tenderness at the sites. Post biopsy instructions and care were reviewed and questions were answered. The patient was encouraged to call [facility] for any additional concerns. The patient was referred to [facility] at [facility] on April 23, 2021. Pathology results reported by Stacie Acres RN on 04/15/2021. Electronically Signed   By: Lajean Manes M.D.   On: 04/15/2021 16:03   Result Date: 04/15/2021 CLINICAL DATA:  Patient presents for ultrasound-guided core needle biopsy of a 3.9 cm highly suspicious left breast mass and stereotactic core needle biopsy an adjacent asymmetry with associated calcifications. EXAM: ULTRASOUND GUIDED LEFT BREAST CORE NEEDLE BIOPSY: BIOPSY #1. STEREOTACTIC GUIDED LEFT BREAST CORE NEEDLE BIOPSY: BIOPSY #2. COMPARISON:  Previous exam(s).  PROCEDURE: BIOPSY #1: 3.9 CM 12 O'CLOCK POSITION BREAST MASS. I met with the patient and we discussed the procedure of ultrasound-guided biopsy, including benefits and alternatives. We discussed the high likelihood of a successful procedure. We discussed the risks of the procedure, including infection, bleeding, tissue injury, clip migration, and inadequate sampling. Informed written consent was given. The usual time-out protocol was performed immediately prior to the procedure. Lesion quadrant: Upper outer quadrant, near 12 o'clock, 4 cm the nipple. Using sterile technique and 1% Lidocaine as local anesthetic, under direct ultrasound visualization, a 12 gauge spring-loaded device was used to perform biopsy of the 3.9 cm spiculated mass using an inferior approach. At the conclusion of the procedure a ribbon shaped tissue marker clip was deployed into the biopsy cavity. BIOPSY #2: 1.3 CM SPAN CALCIFICATIONS AND ASSOCIATED ASYMMETRY. The patient and I discussed the procedure of stereotactic-guided biopsy including benefits and alternatives. We discussed the high likelihood of a successful procedure. We discussed the risks of the procedure including infection, bleeding, tissue injury, clip migration, and inadequate sampling. Informed written consent was given. The usual time out protocol was performed immediately prior to the procedure. Using sterile technique and 1% Lidocaine as local anesthetic, under stereotactic guidance, a 9 gauge vacuum assisted device was used to perform core needle biopsy of calcifications and associated asymmetry lying 2 cm lateral and slightly inferior to the 12 o'clock position mass, using a superior approach. Specimen radiograph was performed showing several calcifications for which biopsy was performed. Specimens with calcifications are identified for pathology. Lesion quadrant: Upper outer quadrant At the conclusion of the procedure, an X shaped tissue marker clip was deployed into the  biopsy cavity. Follow up 2 view mammogram was performed and dictated separately. IMPRESSION: Ultrasound guided biopsy of a left breast mass. Stereotactic guided biopsy left breast calcifications and asymmetry 2 cm lateral and slightly inferior to the left breast mass. No apparent complications. Electronically Signed: By: Lajean Manes M.D. On: 04/11/2021 09:12  [place] LT BREAST BX W  LOC DEV 1ST LESION IMG BX SPEC US GUIDE  Addendum Date: 04/15/2021   ADDENDUM REPORT: 04/15/2021 16:03 ADDENDUM: 1- Pathology revealed GRADE II INVASIVE MAMMARY CARCINOMA- MAMMARY CARCINOMA IN SITU of the LEFT breast, 12 o'clock, 4cmfn (ribbon clip). This was found to be concordant by Dr. Lajean Manes. 2- Pathology revealed GRADE II INVASIVE MAMMARY CARCINOMA of the LEFT breast, upper outer quadrant (X clip). This was found to be concordant by Dr. Lajean Manes. Note: 1. and 2. E-cadherin is NEGATIVE supporting lobular origin. 2. There are microcalcifications associated with benign breast tissue (invasive carcinoma) in the biopsy material. Pathology results were discussed with the patient by telephone. The patient reported doing well after the biopsies with tenderness at the sites. Post biopsy instructions and care were reviewed and questions were answered. The patient was encouraged to call [facility] for any additional concerns. The patient was referred to [facility] at [facility] on April 23, 2021. Pathology results reported by Stacie Acres RN on 04/15/2021. Electronically Signed   By: Lajean Manes M.D.   On: 04/15/2021 16:03   Result Date: 04/15/2021 CLINICAL DATA:  Patient presents for ultrasound-guided core needle biopsy of a 3.9 cm highly suspicious left breast mass and stereotactic core needle biopsy an adjacent asymmetry with associated calcifications. EXAM: ULTRASOUND GUIDED LEFT BREAST CORE NEEDLE BIOPSY: BIOPSY #1. STEREOTACTIC  GUIDED LEFT BREAST CORE NEEDLE BIOPSY: BIOPSY #2. COMPARISON:  Previous exam(s). PROCEDURE: BIOPSY #1: 3.9 CM 12 O'CLOCK POSITION BREAST MASS. I met with the patient and we discussed the procedure of ultrasound-guided biopsy, including benefits and alternatives. We discussed the high likelihood of a successful procedure. We discussed the risks of the procedure, including infection, bleeding, tissue injury, clip migration, and inadequate sampling. Informed written consent was given. The usual time-out protocol was performed immediately prior to the procedure. Lesion quadrant: Upper outer quadrant, near 12 o'clock, 4 cm the nipple. Using sterile technique and 1% Lidocaine as local anesthetic, under direct ultrasound visualization, a 12 gauge spring-loaded device was used to perform biopsy of the 3.9 cm spiculated mass using an inferior approach. At the conclusion of the procedure a ribbon shaped tissue marker clip was deployed into the biopsy cavity. BIOPSY #2: 1.3 CM SPAN CALCIFICATIONS AND ASSOCIATED ASYMMETRY. The patient and I discussed the procedure of stereotactic-guided biopsy including benefits and alternatives. We discussed the high likelihood of a successful procedure. We discussed the risks of the procedure including infection, bleeding, tissue injury, clip migration, and inadequate sampling. Informed written consent was given. The usual time out protocol was performed immediately prior to the procedure. Using sterile technique and 1% Lidocaine as local anesthetic, under stereotactic guidance, a 9 gauge vacuum assisted device was used to perform core needle biopsy of calcifications and associated asymmetry lying 2 cm lateral and slightly inferior to the 12 o'clock position mass, using a superior approach. Specimen radiograph was performed showing several calcifications for which biopsy was performed. Specimens with calcifications are identified for pathology. Lesion quadrant: Upper outer quadrant At the  conclusion of the procedure, an X shaped tissue marker clip was deployed into the biopsy cavity. Follow up 2 view mammogram was performed and dictated separately. IMPRESSION: Ultrasound guided biopsy of a left breast mass. Stereotactic guided biopsy left breast calcifications and asymmetry 2 cm lateral and slightly inferior to the left breast mass. No apparent complications. Electronically Signed: By: Lajean Manes M.D. On: 04/11/2021 09:12    Orders Placed This  Encounter  Procedures   Ambulatory referral to Physical Therapy    Referral Priority:   Routine    Referral Type:   Physical Medicine    Referral Reason:   Specialty Services Required    Requested Specialty:   Physical Therapy    Number of Visits Requested:   1    All questions were answered. The patient knows to call the clinic with any problems, questions or concerns. The total time spent in the appointment was 50 minutes.     Truitt Merle, MD 04/23/2021 8:22 PM  I, Wilburn Mylar, am acting as scribe for Truitt Merle, MD.   I have reviewed the above documentation for accuracy and completeness, and I agree with the above.

## 2021-04-23 ENCOUNTER — Inpatient Hospital Stay: Payer: No Typology Code available for payment source | Admitting: Licensed Clinical Social Worker

## 2021-04-23 ENCOUNTER — Inpatient Hospital Stay: Payer: No Typology Code available for payment source | Attending: Hematology

## 2021-04-23 ENCOUNTER — Encounter: Payer: Self-pay | Admitting: *Deleted

## 2021-04-23 ENCOUNTER — Encounter: Payer: Self-pay | Admitting: Hematology

## 2021-04-23 ENCOUNTER — Encounter: Payer: Self-pay | Admitting: Genetic Counselor

## 2021-04-23 ENCOUNTER — Inpatient Hospital Stay (HOSPITAL_BASED_OUTPATIENT_CLINIC_OR_DEPARTMENT_OTHER): Payer: Self-pay | Admitting: Hematology

## 2021-04-23 ENCOUNTER — Other Ambulatory Visit: Payer: Self-pay | Admitting: General Surgery

## 2021-04-23 ENCOUNTER — Ambulatory Visit
Admission: RE | Admit: 2021-04-23 | Discharge: 2021-04-23 | Disposition: A | Discharge status: Home / Self Care | Payer: Self-pay | Source: Ambulatory Visit | Attending: Radiation Oncology | Admitting: Radiation Oncology

## 2021-04-23 ENCOUNTER — Other Ambulatory Visit: Payer: Self-pay | Admitting: *Deleted

## 2021-04-23 ENCOUNTER — Other Ambulatory Visit: Payer: Self-pay

## 2021-04-23 VITALS — BP 134/77 | HR 70 | Temp 97.9°F | Resp 18 | Ht 63.0 in | Wt 131.2 lb

## 2021-04-23 DIAGNOSIS — C50412 Malignant neoplasm of upper-outer quadrant of left female breast: Secondary | ICD-10-CM | POA: Insufficient documentation

## 2021-04-23 DIAGNOSIS — Z803 Family history of malignant neoplasm of breast: Secondary | ICD-10-CM

## 2021-04-23 DIAGNOSIS — Z170 Estrogen receptor positive status [ER+]: Secondary | ICD-10-CM

## 2021-04-23 DIAGNOSIS — Z1379 Encounter for other screening for genetic and chromosomal anomalies: Secondary | ICD-10-CM | POA: Insufficient documentation

## 2021-04-23 DIAGNOSIS — Z800 Family history of malignant neoplasm of digestive organs: Secondary | ICD-10-CM

## 2021-04-23 DIAGNOSIS — F17210 Nicotine dependence, cigarettes, uncomplicated: Secondary | ICD-10-CM

## 2021-04-23 DIAGNOSIS — I10 Essential (primary) hypertension: Secondary | ICD-10-CM

## 2021-04-23 DIAGNOSIS — Z801 Family history of malignant neoplasm of trachea, bronchus and lung: Secondary | ICD-10-CM

## 2021-04-23 DIAGNOSIS — I341 Nonrheumatic mitral (valve) prolapse: Secondary | ICD-10-CM

## 2021-04-23 LAB — CMP (CANCER CENTER ONLY)
ALT: 22 U/L (ref 0–44)
AST: 17 U/L (ref 15–41)
Albumin: 4.3 g/dL (ref 3.5–5.0)
Alkaline Phosphatase: 57 U/L (ref 38–126)
Anion gap: 10 (ref 5–15)
BUN: 12 mg/dL (ref 6–20)
CO2: 26 mmol/L (ref 22–32)
Calcium: 9.5 mg/dL (ref 8.9–10.3)
Chloride: 107 mmol/L (ref 98–111)
Creatinine: 0.88 mg/dL (ref 0.44–1.00)
GFR, Estimated: >60 mL/min (ref >60)
Glucose, Bld: 73 mg/dL (ref 70–99)
Potassium: 3.7 mmol/L (ref 3.5–5.1)
Sodium: 143 mmol/L (ref 135–145)
Total Bilirubin: 0.9 mg/dL (ref 0.3–1.2)
Total Protein: 7.2 g/dL (ref 6.5–8.1)

## 2021-04-23 LAB — CBC WITH DIFFERENTIAL (CANCER CENTER ONLY)
Abs Immature Granulocytes: 0.02 10*3/uL (ref 0.00–0.07)
Basophils Absolute: 0.1 10*3/uL (ref 0.0–0.1)
Basophils Relative: 1 %
Eosinophils Absolute: 0.1 10*3/uL (ref 0.0–0.5)
Eosinophils Relative: 2 %
HCT: 42.3 % (ref 36.0–46.0)
Hemoglobin: 14.4 g/dL (ref 12.0–15.0)
Immature Granulocytes: 0 %
Lymphocytes Relative: 28 %
Lymphs Abs: 2.3 10*3/uL (ref 0.7–4.0)
MCH: 30.6 pg (ref 26.0–34.0)
MCHC: 34 g/dL (ref 30.0–36.0)
MCV: 89.8 fL (ref 80.0–100.0)
Monocytes Absolute: 0.6 10*3/uL (ref 0.1–1.0)
Monocytes Relative: 7 %
Neutro Abs: 5.1 10*3/uL (ref 1.7–7.7)
Neutrophils Relative %: 62 %
Platelet Count: 393 10*3/uL (ref 150–400)
RBC: 4.71 MIL/uL (ref 3.87–5.11)
RDW: 12.8 % (ref 11.5–15.5)
WBC Count: 8.3 10*3/uL (ref 4.0–10.5)
nRBC: 0 % (ref 0.0–0.2)

## 2021-04-23 LAB — GENETIC SCREENING ORDER

## 2021-04-23 NOTE — Progress Notes (Signed)
[place] Work  Initial Assessment   Brenda Waters is a 55 y.o. year old female accompanied by spouse, Brenda Waters. Clinical Social Work was referred by [facility] for assessment of psychosocial needs.   SDOH (Social Determinants of Health) assessments performed: Yes SDOH Interventions    Flowsheet Row Most Recent Value  SDOH Interventions   Food Insecurity Interventions Intervention Not Indicated  Housing Interventions Intervention Not Indicated  Transportation Interventions Intervention Not Indicated       Distress Screen completed: Yes ONCBCN DISTRESS SCREENING 04/23/2021  Screening Type Initial Screening  Distress experienced in past week (1-10) 9  Practical problem type Housing;Work/school  Family Problem type Children  Emotional problem type Nervousness/Anxiety;Adjusting to illness;Isolation/feeling alone;Feeling hopeless      Family/Social Information:  Housing Arrangement: patient lives with husband. Son currently in the home but they are attempting to get him to move out . Daughter lives in [place] Family members/support persons in your life? Husband, daughter Transportation concerns: no  Employment: Working full time for [employer]. Stressful environment due to boss- hoping to stop working there. Income source: Employment Financial concerns:  potentially with treatment if she does not qualify for Medicaid Type of concern: Medical bills Food access concerns: no Services Currently in place:  n/a  Coping/ Adjustment to diagnosis: Patient understands treatment plan and what happens next? yes Concerns about diagnosis and/or treatment: How I will pay for the services I need Patient reported stressors: Children, Anxiety, and Adjusting to my illness Hopes and priorities: hopes to be treated and be well again and have less stress at home by getting son out of the home Patient enjoys gardening and cooking, watching sports, playing games on phone Current coping skills/  strengths: Capable of independent living , Motivation for treatment/growth , Special hobby/interest , and Supportive family/friends     SUMMARY: Current SDOH Barriers:  Financial constraints related to lack of insurance  Clinical Social Work Clinical Goal(s):  patient will work with [employer] team to address needs related to insurance  Interventions: Discussed common feeling and emotions when being diagnosed with cancer, and the importance of support during treatment Informed patient of the support team roles and support services at [facility] Provided [place] contact information and encouraged patient to call with any questions or concerns Provided patient with information about [facility]   Follow Up Plan: Patient will contact CSW with any support or resource needs Patient verbalizes understanding of plan: Yes    Christeen Douglas LCSW

## 2021-04-28 ENCOUNTER — Ambulatory Visit
Admission: RE | Admit: 2021-04-28 | Discharge: 2021-04-28 | Disposition: A | Discharge status: Home / Self Care | Payer: No Typology Code available for payment source | Source: Ambulatory Visit | Attending: General Surgery | Admitting: General Surgery

## 2021-04-28 ENCOUNTER — Other Ambulatory Visit: Payer: Self-pay

## 2021-04-28 DIAGNOSIS — Z170 Estrogen receptor positive status [ER+]: Secondary | ICD-10-CM

## 2021-04-28 MED ORDER — GADOBUTROL 1 MMOL/ML IV SOLN
7.0000 mL | Freq: Once | INTRAVENOUS | Status: AC | PRN
  Administered 2021-04-28: 11:00:00 7 mL via INTRAVENOUS

## 2021-04-29 ENCOUNTER — Telehealth: Payer: Self-pay | Admitting: *Deleted

## 2021-04-29 ENCOUNTER — Encounter: Payer: Self-pay | Admitting: *Deleted

## 2021-04-29 NOTE — Telephone Encounter (Signed)
Left vm regarding MRI results as well as f/u from [facility] from 12.14.22. Contact information provided for questions or needs.

## 2021-04-29 NOTE — Telephone Encounter (Signed)
Physician team notified of MRI results for review

## 2021-04-30 ENCOUNTER — Ambulatory Visit (INDEPENDENT_AMBULATORY_CARE_PROVIDER_SITE_OTHER): Payer: Self-pay | Admitting: Plastic Surgery

## 2021-04-30 ENCOUNTER — Other Ambulatory Visit: Payer: Self-pay

## 2021-04-30 ENCOUNTER — Encounter: Payer: Self-pay | Admitting: Plastic Surgery

## 2021-04-30 VITALS — BP 136/83 | HR 78 | Ht 62.0 in | Wt <= 1120 oz

## 2021-04-30 DIAGNOSIS — C50412 Malignant neoplasm of upper-outer quadrant of left female breast: Secondary | ICD-10-CM

## 2021-04-30 DIAGNOSIS — Z170 Estrogen receptor positive status [ER+]: Secondary | ICD-10-CM

## 2021-04-30 NOTE — Progress Notes (Signed)
Referring Provider Stark Klein, MD [address] [place],  [place] [address]   CC:  Chief Complaint  Patient presents with   consult      Brenda Waters is an 55 y.o. female.  HPI: Patient presents to discuss her options for breast reconstruction.  She was recently diagnosed with a palpable left-sided breast mass.  Imaging and subsequent biopsy confirmed 5.3 cm cancer.  She has been meeting with the cancer team and has not completely decided between mastectomy or lumpectomy.  It is expected that she might need postmastectomy radiation if she does go that route given the size of the lesion.  She is currently a daily smoker.  She currently does not have health insurance and this is a big factor for her in decision-making.  No Known Allergies  Outpatient Encounter Medications as of 04/30/2021  Medication Sig   acetaminophen (TYLENOL) 325 MG tablet Take 650 mg by mouth every 6 (six) hours as needed.   Apple Cider Vinegar 500 MG TABS Take 450 mg by mouth.   ciclopirox (PENLAC) 8 % solution Apply 1 drop topically daily.   losartan-hydrochlorothiazide (HYZAAR) 50-12.5 MG tablet Take 1 tablet by mouth daily.   Multiple Vitamin (MULTIVITAMIN WITH MINERALS) TABS tablet Take 1 tablet by mouth daily.   No facility-administered encounter medications on file as of 04/30/2021.     Past Medical History:  Diagnosis Date   Breast cancer ([place])    Hypertension    Mitral valve prolapse     Past Surgical History:  Procedure Laterality Date   CESAREAN SECTION      Family History  Problem Relation Age of Onset   Cancer Father 12       Oral   Breast cancer Paternal Grandmother    Lung cancer Paternal Uncle        firefighter   Colon cancer Other    Colon cancer Paternal Great-grandmother    Colon cancer Paternal Great-grandfather    Leukemia Half-Brother     Social History   Social History Narrative   Not on file     Review of Systems General: Denies fevers, chills,  weight loss CV: Denies chest pain, shortness of breath, palpitations  Physical Exam Vitals with BMI 04/30/2021 04/23/2021 05/04/2017  Height 5\' 2"  5\' 3"  -  Weight 10 lbs 131 lbs 3 oz -  BMI 7.98 92.11 -  Systolic 941 740 814  Diastolic 83 77 481  Pulse 78 70 -    General:  No acute distress,  Alert and oriented, Non-Toxic, Normal speech and affect Breast exam: Deferred  Assessment/Plan Had a long discussion with the patient about her options.  We discussed autologous reconstruction and implant-based reconstruction.  We discussed risks and benefits of both.  We discussed that implant-based reconstruction would be a staged procedure requiring switch to final implant from an expander.  I did explain to her that this could be performed as an immediate reconstruction.  If she does require postmastectomy radiation she would likely require latissimus flap and a tissue expander and potentially a contralateral procedure for symmetry.  I did also discussed with her that it is uncommon to do the breast reconstruction in the self-pay setting due to the cost but that if she wanted to move forward with that I would go that direction.  At the moment from a cost standpoint she is worried about paying further cancer treatment and prefers to focus on that for now.  After the cancer  is treated and everything settles out I am more than happy to see her and discuss delayed reconstruction.  Certainly if something changes with her insurance between now and the time of her surgery and that influences her decision then we can get her back in and reevaluate.  All of her questions were answered.  Cindra Presume 04/30/2021, 5:49 PM

## 2021-05-09 ENCOUNTER — Other Ambulatory Visit: Payer: Self-pay | Admitting: General Surgery

## 2021-05-15 ENCOUNTER — Encounter: Payer: Self-pay | Admitting: *Deleted

## 2021-05-19 ENCOUNTER — Encounter: Payer: Self-pay | Admitting: *Deleted

## 2021-06-06 NOTE — Progress Notes (Signed)
Surgical Instructions    Your procedure is scheduled on Thursday February 2nd.  Report [facility] Main Entr[facility]A" at 5:30 A.M., then check in with the Admitting office.  Call this number if you have problems the morning of surgery:  [phone]   If you have any questions prior to your surgery date call [phone]: Open Monday-Friday 8am-4pm    Remember:  Do not eat after midnight the night before your surgery  You may drink clear liquids until 4:30am the morning of your surgery.   Clear liquids allowed are: Water, Non-Citrus Juices (without pulp), Carbonated Beverages, Clear Tea, Black Coffee ONLY (NO MILK, CREAM OR POWDERED CREAMER of any kind), and Gatorade    Take these medicines the morning of surgery with A SIP OF WATER IF NEEDED  acetaminophen (TYLENOL) 325 MG tablet    As of today, STOP taking any Aspirin (unless otherwise instructed by your surgeon) Aleve, Naproxen, Ibuprofen, Motrin, Advil, Goody's, BC's, all herbal medications, fish oil, and all vitamins.  After your COVID test   You are not required to quarantine however you are required to wear a well-fitting mask when you are out and around people not in your household.  If your mask becomes wet or soiled, replace with a new one.  Wash your hands often with soap and water for 20 seconds or clean your hands with an alcohol-based hand sanitizer that contains at least 60% alcohol.  Do not share personal items.  Notify your provider: if you are in close contact with someone who has COVID  or if you develop a fever of 100.4 or greater, sneezing, cough, sore throat, shortness of breath or body aches.           Do not wear jewelry or makeup Do not wear lotions, powders, perfumes, or deodorant. Do not shave 48 hours prior to surgery.   Do not bring valuables to the hospital. DO Not wear nail polish, gel polish, artificial nails, or any other type of covering on natural nails (fingers and toes) If you have  artificial nails or gel coating that need to be removed by a nail salon, please have this removed prior to surgery. Artificial nails or gel coating may interfere with anesthesia's ability to adequately monitor your vital signs.              is not responsible for any belongings or valuables.  Do NOT Smoke (Tobacco/Vaping)  24 hours prior to your procedure  If you use a CPAP at night, you may bring your mask for your overnight stay.   Contacts, glasses, hearing aids, dentures or partials may not be worn into surgery, please bring cases for these belongings   For patients admitted to the hospital, discharge time will be determined by your treatment team.   Patients discharged the day of surgery will not be allowed to drive home, and someone needs to stay with them for 24 hours.  NO VISITORS WILL BE ALLOWED IN PRE-OP WHERE PATIENTS ARE PREPPED FOR SURGERY.  ONLY 1 SUPPORT PERSON MAY BE PRESENT IN THE WAITING ROOM WHILE YOU ARE IN SURGERY.  IF YOU ARE TO BE ADMITTED, ONCE YOU ARE IN YOUR ROOM YOU WILL BE ALLOWED TWO (2) VISITORS. 1 (ONE) VISITOR MAY STAY OVERNIGHT BUT MUST ARRIVE TO THE ROOM BY 8pm.  Minor children may have two parents present. Special consideration for safety and communication needs will be reviewed on a case by case basis.  Special instructions:  Oral Hygiene is also important to reduce your risk of infection.  Remember - BRUSH YOUR TEETH THE MORNING OF SURGERY WITH YOUR REGULAR TOOTHPASTE   [place]- Preparing For Surgery  Before surgery, you can play an important role. Because skin is not sterile, your skin needs to be as free of germs as possible. You can reduce the number of germs on your skin by washing with CHG (chlorahexidine gluconate) Soap before surgery.  CHG is an antiseptic cleaner which kills germs and bonds with the skin to continue killing germs even after washing.     Please do not use if you have an allergy to CHG or antibacterial soaps. If  your skin becomes reddened/irritated stop using the CHG.  Do not shave (including legs and underarms) for at least 48 hours prior to first CHG shower. It is OK to shave your face.  Please follow these instructions carefully.     Shower the NIGHT BEFORE SURGERY and the MORNING OF SURGERY with CHG Soap.   If you chose to wash your hair, wash your hair first as usual with your normal shampoo. After you shampoo, rinse your hair and body thoroughly to remove the shampoo.  Then [employer] and genitals (private parts) with your normal soap and rinse thoroughly to remove soap.  After that Use CHG Soap as you would any other liquid soap. You can apply CHG directly to the skin and wash gently with a scrungie or a clean washcloth.   Apply the CHG Soap to your body ONLY FROM THE NECK DOWN.  Do not use on open wounds or open sores. Avoid contact with your eyes, ears, mouth and genitals (private parts). Wash Face and genitals (private parts)  with your normal soap.   Wash thoroughly, paying special attention to the area where your surgery will be performed.  Thoroughly rinse your body with warm water from the neck down.  DO NOT shower/wash with your normal soap after using and rinsing off the CHG Soap.  Pat yourself dry with a CLEAN TOWEL.  Wear CLEAN PAJAMAS to bed the night before surgery  Place CLEAN SHEETS on your bed the night before your surgery  DO NOT SLEEP WITH PETS.   Day of Surgery:  Take a shower with CHG soap. Wear Clean/Comfortable clothing the morning of surgery Do not apply any deodorants/lotions.   Remember to brush your teeth WITH YOUR REGULAR TOOTHPASTE.   Please read over the following fact sheets that you were given.

## 2021-06-09 ENCOUNTER — Encounter (HOSPITAL_COMMUNITY)
Admission: RE | Admit: 2021-06-09 | Discharge: 2021-06-09 | Disposition: A | Discharge status: Home / Self Care | Payer: No Typology Code available for payment source | Source: Ambulatory Visit | Attending: General Surgery | Admitting: General Surgery

## 2021-06-09 ENCOUNTER — Other Ambulatory Visit: Payer: Self-pay

## 2021-06-09 ENCOUNTER — Encounter (HOSPITAL_COMMUNITY): Payer: Self-pay

## 2021-06-09 VITALS — BP 133/82 | HR 82 | Temp 98.0°F | Resp 18 | Ht 61.0 in | Wt 130.9 lb

## 2021-06-09 DIAGNOSIS — Z20822 Contact with and (suspected) exposure to covid-19: Secondary | ICD-10-CM | POA: Insufficient documentation

## 2021-06-09 DIAGNOSIS — K219 Gastro-esophageal reflux disease without esophagitis: Secondary | ICD-10-CM | POA: Insufficient documentation

## 2021-06-09 DIAGNOSIS — Z170 Estrogen receptor positive status [ER+]: Secondary | ICD-10-CM | POA: Insufficient documentation

## 2021-06-09 DIAGNOSIS — Z01818 Encounter for other preprocedural examination: Secondary | ICD-10-CM | POA: Insufficient documentation

## 2021-06-09 DIAGNOSIS — C50412 Malignant neoplasm of upper-outer quadrant of left female breast: Secondary | ICD-10-CM | POA: Insufficient documentation

## 2021-06-09 DIAGNOSIS — I10 Essential (primary) hypertension: Secondary | ICD-10-CM | POA: Insufficient documentation

## 2021-06-09 HISTORY — DX: Gastro-esophageal reflux disease without esophagitis: K21.9

## 2021-06-09 HISTORY — DX: Cardiac murmur, unspecified: R01.1

## 2021-06-09 LAB — SARS CORONAVIRUS 2 (TAT 6-24 HRS): SARS Coronavirus 2: NEGATIVE

## 2021-06-09 LAB — CBC
HCT: 41.4 % (ref 36.0–46.0)
Hemoglobin: 14.4 g/dL (ref 12.0–15.0)
MCH: 31.4 pg (ref 26.0–34.0)
MCHC: 34.8 g/dL (ref 30.0–36.0)
MCV: 90.2 fL (ref 80.0–100.0)
Platelets: 377 10*3/uL (ref 150–400)
RBC: 4.59 MIL/uL (ref 3.87–5.11)
RDW: 12.9 % (ref 11.5–15.5)
WBC: 9.3 10*3/uL (ref 4.0–10.5)
nRBC: 0 % (ref 0.0–0.2)

## 2021-06-09 NOTE — Progress Notes (Addendum)
PCP - Almedia Balls, NP Cardiologist - Denies  Chest x-ray - Not indicated EKG - 06/09/21 Stress Test - Denies ECHO - Denies Cardiac Cath - Denies  Sleep Study - Denies  DM - Denies   ERAS Protcol -Yes   COVID TEST- 06/09/21   Anesthesia review: Yes abnormal EKG  Patient denies shortness of breath, fever, cough and chest pain at PAT appointment   All instructions explained to the patient, with a verbal understanding of the material. Patient agrees to go over the instructions while at home for a better understanding. Patient also instructed to wear a mask while in public after being tested for COVID-19. The opportunity to ask questions was provided.

## 2021-06-10 NOTE — Progress Notes (Signed)
Anesthesia Chart Review:  Case: [id number] Date/Time: 06/12/21 0715   Procedure: LEFT MASTECTOMY WITH SENTINEL LYMPH NODE BIOPSY (Left: Breast)   Anesthesia type: General   Pre-op diagnosis: LEFT BREAST CANCER   Location: [facility] OR ROOM 02 / [facility]   Surgeons: Stark Klein, MD       DISCUSSION: Patient is a 56 year old female scheduled for the above procedure.  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 Patient was no longer in PAT. Reported history of MVP but denied prior echo. "No murmur" and "no edema" documented in 04/23/21 exams by Dr. Barry Dienes and Dr. Burr Medico. She denied chest pain and SOB. 06/09/21 EKG showed SR, poor r wave progression. 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   06/09/21 presurgical COVID-19 test negative. Anesthesia team to evaluate on the day of surgery.    VS: BP 133/82    Pulse 82    Temp 36.7 C (Oral)    Resp 18    Ht 5\' 1"  (1.549 m)    Wt 59.4 kg    SpO2 100%    BMI 24.73 kg/m  GYN status is listed as "Postmenopausal".  PROVIDERS: Almedia Balls, NP is PCP  Truitt Merle, MD is Edson Snowball, MD is RAD-ONC   LABS: Labs reviewed: Acceptable for surgery. CMET normal on 04/23/21.  (all labs ordered are listed, but only abnormal results are displayed)  Labs Reviewed  SARS CORONAVIRUS 2 (TAT 6-24 HRS)  CBC     EKG: 06/09/21: Normal sinus rhythm Possible Anterior infarct , age undetermined Abnormal ECG No previous ECGs available Confirmed by Oswaldo Milian [phone]) on 06/09/2021 11:19:42 PM   CV: N/A  Past Medical History:  Diagnosis Date   Breast cancer ([place])    GERD (gastroesophageal reflux disease)    Heart murmur    Hypertension    Mitral valve prolapse     Past Surgical History:  Procedure Laterality Date   CESAREAN SECTION      MEDICATIONS:   acetaminophen (TYLENOL) 325 MG tablet   Apple Cider Vinegar 500 MG TABS   ciclopirox (PENLAC) 8 % solution   fluticasone (FLONASE) 50 MCG/ACT nasal spray   losartan-hydrochlorothiazide (HYZAAR) 50-12.5 MG tablet   Multiple Vitamin (MULTIVITAMIN WITH MINERALS) TABS tablet   No current facility-administered medications for this encounter.    Myra Gianotti, PA-C Surgical Short Stay/Anesthesiology [facility] Phone [phone] [facility] Phone [phone] 06/10/2021 12:35 PM

## 2021-06-10 NOTE — Anesthesia Preprocedure Evaluation (Addendum)
Anesthesia Evaluation  Patient identified by MRN, date of birth, ID band Patient awake    Reviewed: Allergy & Precautions, H&P , NPO status , Patient's Chart, lab work & pertinent test results  Airway Mallampati: II  TM Distance: >3 FB Neck ROM: Full    Dental no notable dental hx. (+) Teeth Intact, Dental Advisory Given   Pulmonary neg pulmonary ROS, Current Smoker,    Pulmonary exam normal breath sounds clear to auscultation       Cardiovascular Exercise Tolerance: Good hypertension, Pt. on medications negative cardio ROS Normal cardiovascular exam+ Valvular Problems/Murmurs MVP  Rhythm:Regular Rate:Normal     Neuro/Psych negative neurological ROS  negative psych ROS   GI/Hepatic negative GI ROS, Neg liver ROS, GERD  ,  Endo/Other  negative endocrine ROS  Renal/GU negative Renal ROS  negative genitourinary   Musculoskeletal negative musculoskeletal ROS (+)   Abdominal   Peds negative pediatric ROS (+)  Hematology negative hematology ROS (+)   Anesthesia Other Findings   Reproductive/Obstetrics negative OB ROS                           Anesthesia Physical Anesthesia Plan  ASA: 3  Anesthesia Plan: General and Regional   Post-op Pain Management: Regional block   Induction: Intravenous  PONV Risk Score and Plan: 3 and Ondansetron, Dexamethasone, Midazolam and Treatment may vary due to age or medical condition  Airway Management Planned: Oral ETT and LMA  Additional Equipment:   Intra-op Plan:   Post-operative Plan: Extubation in OR  Informed Consent: I have reviewed the patients History and Physical, chart, labs and discussed the procedure including the risks, benefits and alternatives for the proposed anesthesia with the patient or authorized representative who has indicated his/her understanding and acceptance.     Dental advisory given  Plan Discussed with: CRNA,  Surgeon and Anesthesiologist  Anesthesia Plan Comments: (PAT note written 06/10/2021 by Myra Gianotti, PA-C.  DISCUSSION: Patient is a 56 year old female scheduled for the above procedure.  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 Patient was no longer in PAT. Reported history of MVP but denied prior echo. "No murmur" and "no edema" documented in 04/23/21 exams by Dr. Barry Dienes and Dr. Burr Medico. She denied chest pain and SOB. 06/09/21 EKG showed SR, poor r wave progression. 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 )      Anesthesia Quick Evaluation

## 2021-06-12 ENCOUNTER — Encounter (HOSPITAL_COMMUNITY)
Admission: RE | Disposition: A | Discharge status: Home / Self Care | Payer: Self-pay | Source: Home / Self Care | Attending: General Surgery

## 2021-06-12 ENCOUNTER — Ambulatory Visit (HOSPITAL_COMMUNITY): Payer: 59 | Admitting: Anesthesiology

## 2021-06-12 ENCOUNTER — Ambulatory Visit (HOSPITAL_COMMUNITY): Payer: 59 | Admitting: Vascular Surgery

## 2021-06-12 ENCOUNTER — Other Ambulatory Visit: Payer: Self-pay

## 2021-06-12 ENCOUNTER — Ambulatory Visit (HOSPITAL_COMMUNITY)
Admission: RE | Admit: 2021-06-12 | Discharge: 2021-06-13 | Disposition: A | Discharge status: Home / Self Care | Payer: 59 | Attending: General Surgery | Admitting: General Surgery

## 2021-06-12 ENCOUNTER — Encounter (HOSPITAL_COMMUNITY): Payer: Self-pay | Admitting: General Surgery

## 2021-06-12 DIAGNOSIS — Z170 Estrogen receptor positive status [ER+]: Secondary | ICD-10-CM | POA: Insufficient documentation

## 2021-06-12 DIAGNOSIS — Z806 Family history of leukemia: Secondary | ICD-10-CM | POA: Diagnosis not present

## 2021-06-12 DIAGNOSIS — C773 Secondary and unspecified malignant neoplasm of axilla and upper limb lymph nodes: Secondary | ICD-10-CM | POA: Insufficient documentation

## 2021-06-12 DIAGNOSIS — Z808 Family history of malignant neoplasm of other organs or systems: Secondary | ICD-10-CM | POA: Diagnosis not present

## 2021-06-12 DIAGNOSIS — C50412 Malignant neoplasm of upper-outer quadrant of left female breast: Secondary | ICD-10-CM | POA: Insufficient documentation

## 2021-06-12 DIAGNOSIS — Z803 Family history of malignant neoplasm of breast: Secondary | ICD-10-CM | POA: Diagnosis not present

## 2021-06-12 DIAGNOSIS — C50912 Malignant neoplasm of unspecified site of left female breast: Secondary | ICD-10-CM | POA: Diagnosis not present

## 2021-06-12 DIAGNOSIS — R69 Illness, unspecified: Secondary | ICD-10-CM | POA: Diagnosis not present

## 2021-06-12 DIAGNOSIS — Z800 Family history of malignant neoplasm of digestive organs: Secondary | ICD-10-CM | POA: Diagnosis not present

## 2021-06-12 DIAGNOSIS — F17210 Nicotine dependence, cigarettes, uncomplicated: Secondary | ICD-10-CM | POA: Diagnosis not present

## 2021-06-12 DIAGNOSIS — K219 Gastro-esophageal reflux disease without esophagitis: Secondary | ICD-10-CM | POA: Insufficient documentation

## 2021-06-12 DIAGNOSIS — I10 Essential (primary) hypertension: Secondary | ICD-10-CM | POA: Insufficient documentation

## 2021-06-12 HISTORY — PX: MASTECTOMY W/ SENTINEL NODE BIOPSY: SHX2001

## 2021-06-12 SURGERY — MASTECTOMY WITH SENTINEL LYMPH NODE BIOPSY
Anesthesia: Regional | Site: Breast | Laterality: Left

## 2021-06-12 MED ORDER — DEXMEDETOMIDINE (PRECEDEX) IN NS 20 MCG/5ML (4 MCG/ML) IV SYRINGE
PREFILLED_SYRINGE | INTRAVENOUS | Status: AC
  Filled 2021-06-12: qty 5

## 2021-06-12 MED ORDER — KETAMINE HCL 50 MG/5ML IJ SOSY
PREFILLED_SYRINGE | INTRAMUSCULAR | Status: AC
  Filled 2021-06-12: qty 5

## 2021-06-12 MED ORDER — MIDAZOLAM HCL 2 MG/2ML IJ SOLN
INTRAMUSCULAR | Status: AC
  Filled 2021-06-12: qty 2

## 2021-06-12 MED ORDER — CHLORHEXIDINE GLUCONATE CLOTH 2 % EX PADS: 6.0000 | MEDICATED_PAD | Freq: Once | CUTANEOUS | Status: DC

## 2021-06-12 MED ORDER — DEXAMETHASONE SODIUM PHOSPHATE 10 MG/ML IJ SOLN
INTRAMUSCULAR | Status: AC
  Filled 2021-06-12: qty 1

## 2021-06-12 MED ORDER — METHOCARBAMOL 500 MG PO TABS
500.0000 mg | ORAL_TABLET | Freq: Four times a day (QID) | ORAL | Status: DC | PRN
Start: 2021-06-12 — End: 2021-06-13

## 2021-06-12 MED ORDER — GABAPENTIN 300 MG PO CAPS
300.0000 mg | ORAL_CAPSULE | ORAL | Status: AC
  Administered 2021-06-12: 300 mg via ORAL
  Filled 2021-06-12: qty 1

## 2021-06-12 MED ORDER — PROCHLORPERAZINE EDISYLATE 10 MG/2ML IJ SOLN: 5.0000 mg | Freq: Four times a day (QID) | INTRAMUSCULAR | Status: DC | PRN

## 2021-06-12 MED ORDER — ACETAMINOPHEN 325 MG PO TABS: 325.0000 mg | ORAL_TABLET | ORAL | Status: DC | PRN

## 2021-06-12 MED ORDER — CHLORHEXIDINE GLUCONATE 0.12 % MT SOLN
15.0000 mL | Freq: Once | OROMUCOSAL | Status: AC
  Administered 2021-06-12: 15 mL via OROMUCOSAL
  Filled 2021-06-12: qty 15

## 2021-06-12 MED ORDER — PROCHLORPERAZINE MALEATE 10 MG PO TABS
10.0000 mg | ORAL_TABLET | Freq: Four times a day (QID) | ORAL | Status: DC | PRN
  Filled 2021-06-12: qty 1

## 2021-06-12 MED ORDER — DEXMEDETOMIDINE HCL IN NACL 200 MCG/50ML IV SOLN
INTRAVENOUS | Status: AC
  Filled 2021-06-12: qty 50

## 2021-06-12 MED ORDER — OXYCODONE HCL 5 MG PO TABS: 5.0000 mg | ORAL_TABLET | ORAL | Status: DC | PRN

## 2021-06-12 MED ORDER — CICLOPIROX 8 % EX SOLN: 1.0000 [drp] | Freq: Every day | CUTANEOUS | Status: DC

## 2021-06-12 MED ORDER — FENTANYL CITRATE (PF) 100 MCG/2ML IJ SOLN
INTRAMUSCULAR | Status: AC
  Filled 2021-06-12: qty 2

## 2021-06-12 MED ORDER — ORAL CARE MOUTH RINSE: 15.0000 mL | Freq: Once | OROMUCOSAL | Status: AC

## 2021-06-12 MED ORDER — FENTANYL CITRATE (PF) 250 MCG/5ML IJ SOLN
INTRAMUSCULAR | Status: DC | PRN
  Administered 2021-06-12 (×3): 50 ug via INTRAVENOUS
  Administered 2021-06-12: 25 ug via INTRAVENOUS
  Administered 2021-06-12: 50 ug via INTRAVENOUS
  Administered 2021-06-12: 25 ug via INTRAVENOUS

## 2021-06-12 MED ORDER — CEFAZOLIN SODIUM-DEXTROSE 2-4 GM/100ML-% IV SOLN
2.0000 g | INTRAVENOUS | Status: AC
  Administered 2021-06-12: 2 g via INTRAVENOUS
  Filled 2021-06-12: qty 100

## 2021-06-12 MED ORDER — ROPIVACAINE HCL 7.5 MG/ML IJ SOLN
INTRAMUSCULAR | Status: DC | PRN
  Administered 2021-06-12: 25 mL via PERINEURAL

## 2021-06-12 MED ORDER — GLYCOPYRROLATE PF 0.2 MG/ML IJ SOSY
PREFILLED_SYRINGE | INTRAMUSCULAR | Status: DC | PRN
Start: 2021-06-12 — End: 2021-06-12
  Administered 2021-06-12: .2 mg via INTRAVENOUS

## 2021-06-12 MED ORDER — FLUTICASONE PROPIONATE 50 MCG/ACT NA SUSP
2.0000 | Freq: Every day | NASAL | Status: DC
  Filled 2021-06-12: qty 16

## 2021-06-12 MED ORDER — LOSARTAN POTASSIUM 50 MG PO TABS
50.0000 mg | ORAL_TABLET | Freq: Every day | ORAL | Status: DC
  Administered 2021-06-12: 50 mg via ORAL
  Filled 2021-06-12 (×2): qty 1

## 2021-06-12 MED ORDER — PHENYLEPHRINE HCL-NACL 20-0.9 MG/250ML-% IV SOLN
INTRAVENOUS | Status: DC | PRN
Start: 2021-06-12 — End: 2021-06-12
  Administered 2021-06-12: 20 ug/min via INTRAVENOUS

## 2021-06-12 MED ORDER — PHENYLEPHRINE 40 MCG/ML (10ML) SYRINGE FOR IV PUSH (FOR BLOOD PRESSURE SUPPORT)
PREFILLED_SYRINGE | INTRAVENOUS | Status: DC | PRN
Start: 2021-06-12 — End: 2021-06-12
  Administered 2021-06-12: 80 ug via INTRAVENOUS

## 2021-06-12 MED ORDER — MIDAZOLAM HCL 2 MG/2ML IJ SOLN
INTRAMUSCULAR | Status: DC | PRN
  Administered 2021-06-12 (×2): 1 mg via INTRAVENOUS

## 2021-06-12 MED ORDER — KETOROLAC TROMETHAMINE 30 MG/ML IJ SOLN
30.0000 mg | Freq: Four times a day (QID) | INTRAMUSCULAR | Status: AC
  Administered 2021-06-12 (×2): 30 mg via INTRAVENOUS
  Filled 2021-06-12 (×3): qty 1

## 2021-06-12 MED ORDER — KETOROLAC TROMETHAMINE 30 MG/ML IJ SOLN
30.0000 mg | Freq: Four times a day (QID) | INTRAMUSCULAR | Status: DC | PRN
  Administered 2021-06-13: 30 mg via INTRAVENOUS

## 2021-06-12 MED ORDER — ACETAMINOPHEN 500 MG PO TABS
1000.0000 mg | ORAL_TABLET | Freq: Four times a day (QID) | ORAL | Status: DC
  Administered 2021-06-12 – 2021-06-13 (×3): 1000 mg via ORAL
  Filled 2021-06-12 (×3): qty 2

## 2021-06-12 MED ORDER — FENTANYL CITRATE (PF) 250 MCG/5ML IJ SOLN
INTRAMUSCULAR | Status: AC
  Filled 2021-06-12: qty 5

## 2021-06-12 MED ORDER — DIPHENHYDRAMINE HCL 50 MG/ML IJ SOLN: 12.5000 mg | Freq: Four times a day (QID) | INTRAMUSCULAR | Status: DC | PRN

## 2021-06-12 MED ORDER — 0.9 % SODIUM CHLORIDE (POUR BTL) OPTIME
TOPICAL | Status: DC | PRN
  Administered 2021-06-12: 1000 mL

## 2021-06-12 MED ORDER — EPHEDRINE SULFATE-NACL 50-0.9 MG/10ML-% IV SOSY
PREFILLED_SYRINGE | INTRAVENOUS | Status: DC | PRN
  Administered 2021-06-12: 5 mg via INTRAVENOUS

## 2021-06-12 MED ORDER — ACETAMINOPHEN 500 MG PO TABS
1000.0000 mg | ORAL_TABLET | ORAL | Status: AC
  Administered 2021-06-12: 1000 mg via ORAL
  Filled 2021-06-12: qty 2

## 2021-06-12 MED ORDER — ONDANSETRON 4 MG PO TBDP: 4.0000 mg | ORAL_TABLET | Freq: Four times a day (QID) | ORAL | Status: DC | PRN

## 2021-06-12 MED ORDER — ONDANSETRON HCL 4 MG/2ML IJ SOLN
INTRAMUSCULAR | Status: AC
  Filled 2021-06-12: qty 2

## 2021-06-12 MED ORDER — MELATONIN 3 MG PO TABS: 3.0000 mg | ORAL_TABLET | Freq: Every evening | ORAL | Status: DC | PRN

## 2021-06-12 MED ORDER — SENNA 8.6 MG PO TABS
1.0000 | ORAL_TABLET | Freq: Two times a day (BID) | ORAL | Status: DC
  Administered 2021-06-12 – 2021-06-13 (×3): 8.6 mg via ORAL
  Filled 2021-06-12 (×3): qty 1

## 2021-06-12 MED ORDER — EPHEDRINE 5 MG/ML INJ
INTRAVENOUS | Status: AC
  Filled 2021-06-12: qty 5

## 2021-06-12 MED ORDER — PHENYLEPHRINE 40 MCG/ML (10ML) SYRINGE FOR IV PUSH (FOR BLOOD PRESSURE SUPPORT)
PREFILLED_SYRINGE | INTRAVENOUS | Status: AC
  Filled 2021-06-12: qty 10

## 2021-06-12 MED ORDER — FENTANYL CITRATE (PF) 100 MCG/2ML IJ SOLN
25.0000 ug | INTRAMUSCULAR | Status: DC | PRN
  Administered 2021-06-12 (×2): 50 ug via INTRAVENOUS

## 2021-06-12 MED ORDER — KETAMINE HCL 10 MG/ML IJ SOLN
INTRAMUSCULAR | Status: DC | PRN
Start: 2021-06-12 — End: 2021-06-12
  Administered 2021-06-12: 20 mg via INTRAVENOUS
  Administered 2021-06-12: 10 mg via INTRAVENOUS

## 2021-06-12 MED ORDER — OXYCODONE HCL 5 MG/5ML PO SOLN: 5.0000 mg | Freq: Once | ORAL | Status: DC | PRN

## 2021-06-12 MED ORDER — MEPERIDINE HCL 25 MG/ML IJ SOLN: 6.2500 mg | INTRAMUSCULAR | Status: DC | PRN

## 2021-06-12 MED ORDER — ONDANSETRON HCL 4 MG/2ML IJ SOLN: 4.0000 mg | Freq: Four times a day (QID) | INTRAMUSCULAR | Status: DC | PRN

## 2021-06-12 MED ORDER — LIDOCAINE 2% (20 MG/ML) 5 ML SYRINGE
INTRAMUSCULAR | Status: DC | PRN
  Administered 2021-06-12: 100 mg via INTRAVENOUS

## 2021-06-12 MED ORDER — LIDOCAINE 2% (20 MG/ML) 5 ML SYRINGE
INTRAMUSCULAR | Status: AC
  Filled 2021-06-12: qty 5

## 2021-06-12 MED ORDER — ONDANSETRON HCL 4 MG/2ML IJ SOLN
4.0000 mg | Freq: Once | INTRAMUSCULAR | Status: AC | PRN
  Administered 2021-06-12: 4 mg via INTRAVENOUS

## 2021-06-12 MED ORDER — MORPHINE SULFATE (PF) 2 MG/ML IV SOLN: 1.0000 mg | INTRAVENOUS | Status: DC | PRN

## 2021-06-12 MED ORDER — PROPOFOL 10 MG/ML IV BOLUS
INTRAVENOUS | Status: AC
  Filled 2021-06-12: qty 20

## 2021-06-12 MED ORDER — ONDANSETRON HCL 4 MG/2ML IJ SOLN
INTRAMUSCULAR | Status: DC | PRN
  Administered 2021-06-12: 4 mg via INTRAVENOUS

## 2021-06-12 MED ORDER — LOSARTAN POTASSIUM-HCTZ 50-12.5 MG PO TABS
1.0000 | ORAL_TABLET | Freq: Every day | ORAL | Status: DC
Start: 2021-06-12 — End: 2021-06-12

## 2021-06-12 MED ORDER — DEXAMETHASONE SODIUM PHOSPHATE 10 MG/ML IJ SOLN
INTRAMUSCULAR | Status: DC | PRN
Start: 2021-06-12 — End: 2021-06-12
  Administered 2021-06-12: 10 mg via INTRAVENOUS

## 2021-06-12 MED ORDER — HYDROCHLOROTHIAZIDE 12.5 MG PO TABS
12.5000 mg | ORAL_TABLET | Freq: Every day | ORAL | Status: DC
  Administered 2021-06-12: 12.5 mg via ORAL
  Filled 2021-06-12 (×2): qty 1

## 2021-06-12 MED ORDER — GABAPENTIN 100 MG PO CAPS
200.0000 mg | ORAL_CAPSULE | Freq: Two times a day (BID) | ORAL | Status: DC
  Administered 2021-06-12 – 2021-06-13 (×3): 200 mg via ORAL
  Filled 2021-06-12 (×3): qty 2

## 2021-06-12 MED ORDER — LACTATED RINGERS IV SOLN: INTRAVENOUS | Status: DC

## 2021-06-12 MED ORDER — BUPIVACAINE-EPINEPHRINE (PF) 0.25% -1:200000 IJ SOLN
INTRAMUSCULAR | Status: AC
  Filled 2021-06-12: qty 30

## 2021-06-12 MED ORDER — PROPOFOL 10 MG/ML IV BOLUS
INTRAVENOUS | Status: DC | PRN
Start: 2021-06-12 — End: 2021-06-12
  Administered 2021-06-12: 150 mg via INTRAVENOUS

## 2021-06-12 MED ORDER — TRAMADOL HCL 50 MG PO TABS: 50.0000 mg | ORAL_TABLET | Freq: Four times a day (QID) | ORAL | Status: DC | PRN

## 2021-06-12 MED ORDER — LIDOCAINE HCL 1 % IJ SOLN
INTRAMUSCULAR | Status: AC
  Filled 2021-06-12: qty 20

## 2021-06-12 MED ORDER — KCL IN DEXTROSE-NACL 20-5-0.45 MEQ/L-%-% IV SOLN
INTRAVENOUS | Status: DC
  Filled 2021-06-12 (×2): qty 1000

## 2021-06-12 MED ORDER — ACETAMINOPHEN 160 MG/5ML PO SOLN: 325.0000 mg | ORAL | Status: DC | PRN

## 2021-06-12 MED ORDER — OXYCODONE HCL 5 MG PO TABS: 5.0000 mg | ORAL_TABLET | Freq: Once | ORAL | Status: DC | PRN

## 2021-06-12 MED ORDER — CEFAZOLIN SODIUM-DEXTROSE 2-4 GM/100ML-% IV SOLN
2.0000 g | Freq: Three times a day (TID) | INTRAVENOUS | Status: AC
  Administered 2021-06-12: 2 g via INTRAVENOUS
  Filled 2021-06-12: qty 100

## 2021-06-12 MED ORDER — DIPHENHYDRAMINE HCL 12.5 MG/5ML PO ELIX: 12.5000 mg | ORAL_SOLUTION | Freq: Four times a day (QID) | ORAL | Status: DC | PRN

## 2021-06-12 MED ORDER — DEXMEDETOMIDINE (PRECEDEX) IN NS 20 MCG/5ML (4 MCG/ML) IV SYRINGE
PREFILLED_SYRINGE | INTRAVENOUS | Status: DC | PRN
  Administered 2021-06-12 (×2): 4 ug via INTRAVENOUS

## 2021-06-12 MED ORDER — MAGTRACE LYMPHATIC TRACER
INTRAMUSCULAR | Status: DC | PRN
  Administered 2021-06-12: 2 mL via INTRAMUSCULAR

## 2021-06-12 SURGICAL SUPPLY — 58 items
BAG COUNTER SPONGE SURGICOUNT (BAG) ×2 IMPLANT
BINDER BREAST LRG (GAUZE/BANDAGES/DRESSINGS) ×1 IMPLANT
BIOPATCH RED 1 DISK 7.0 (GAUZE/BANDAGES/DRESSINGS) ×2 IMPLANT
BNDG COHESIVE 4X5 TAN STRL (GAUZE/BANDAGES/DRESSINGS) ×2 IMPLANT
CANISTER SUCT 3000ML PPV (MISCELLANEOUS) ×2 IMPLANT
CHLORAPREP W/TINT 26 (MISCELLANEOUS) ×2 IMPLANT
CLIP VESOCCLUDE MED 6/CT (CLIP) ×2 IMPLANT
CLIP VESOCCLUDE SM WIDE 6/CT (CLIP) ×2 IMPLANT
CNTNR URN SCR LID CUP LEK RST (MISCELLANEOUS) ×1 IMPLANT
CONT SPEC 4OZ STRL OR WHT (MISCELLANEOUS) ×2
COVER PROBE W GEL 5X96 (DRAPES) ×3 IMPLANT
COVER SURGICAL LIGHT HANDLE (MISCELLANEOUS) ×2 IMPLANT
DERMABOND ADVANCED (GAUZE/BANDAGES/DRESSINGS) ×1
DERMABOND ADVANCED .7 DNX12 (GAUZE/BANDAGES/DRESSINGS) ×1 IMPLANT
DRAIN CHANNEL 19F RND (DRAIN) ×2 IMPLANT
DRSG TEGADERM 4X4.75 (GAUZE/BANDAGES/DRESSINGS) ×2 IMPLANT
ELECT BLADE 4.0 EZ CLEAN MEGAD (MISCELLANEOUS) ×2
ELECT CAUTERY BLADE 6.4 (BLADE) ×2 IMPLANT
ELECT REM PT RETURN 9FT ADLT (ELECTROSURGICAL) ×2
ELECTRODE BLDE 4.0 EZ CLN MEGD (MISCELLANEOUS) IMPLANT
ELECTRODE REM PT RTRN 9FT ADLT (ELECTROSURGICAL) ×1 IMPLANT
EVACUATOR SILICONE 100CC (DRAIN) ×2 IMPLANT
FILTER STRAW FLUID ASPIR (MISCELLANEOUS) IMPLANT
GLOVE SURG ENC MOIS LTX SZ6 (GLOVE) ×2 IMPLANT
GLOVE SURG UNDER LTX SZ6.5 (GLOVE) ×2 IMPLANT
GOWN STRL REUS W/ TWL LRG LVL3 (GOWN DISPOSABLE) ×1 IMPLANT
GOWN STRL REUS W/TWL 2XL LVL3 (GOWN DISPOSABLE) ×2 IMPLANT
GOWN STRL REUS W/TWL LRG LVL3 (GOWN DISPOSABLE) ×2
KIT BASIN OR (CUSTOM PROCEDURE TRAY) ×2 IMPLANT
KIT TURNOVER KIT B (KITS) ×2 IMPLANT
MARKER SKIN DUAL TIP RULER LAB (MISCELLANEOUS) ×2 IMPLANT
NDL 18GX1X1/2 (RX/OR ONLY) (NEEDLE) IMPLANT
NDL HYPO 25GX1X1/2 BEV (NEEDLE) IMPLANT
NDL SPNL 22GX3.5 QUINCKE BK (NEEDLE) ×1 IMPLANT
NEEDLE 18GX1X1/2 (RX/OR ONLY) (NEEDLE) ×2 IMPLANT
NEEDLE HYPO 25GX1X1/2 BEV (NEEDLE) ×2 IMPLANT
NEEDLE SPNL 22GX3.5 QUINCKE BK (NEEDLE) ×2 IMPLANT
NS IRRIG 1000ML POUR BTL (IV SOLUTION) ×2 IMPLANT
PACK GENERAL/GYN (CUSTOM PROCEDURE TRAY) ×2 IMPLANT
PACK UNIVERSAL I (CUSTOM PROCEDURE TRAY) ×2 IMPLANT
PAD ARMBOARD 7.5X6 YLW CONV (MISCELLANEOUS) ×2 IMPLANT
PENCIL SMOKE EVACUATOR (MISCELLANEOUS) ×2 IMPLANT
SPECIMEN JAR X LARGE (MISCELLANEOUS) ×2 IMPLANT
SPONGE T-LAP 18X18 ~~LOC~~+RFID (SPONGE) ×2 IMPLANT
STAPLER VISISTAT 35W (STAPLE) ×2 IMPLANT
STOCKINETTE IMPERVIOUS 9X36 MD (GAUZE/BANDAGES/DRESSINGS) ×2 IMPLANT
STRIP CLOSURE SKIN 1/2X4 (GAUZE/BANDAGES/DRESSINGS) ×2 IMPLANT
SUT ETHILON 2 0 FS 18 (SUTURE) ×2 IMPLANT
SUT MON AB 4-0 PC3 18 (SUTURE) ×2 IMPLANT
SUT SILK 2 0 (SUTURE) ×2
SUT SILK 2 0 PERMA HAND 18 BK (SUTURE) ×2 IMPLANT
SUT SILK 2-0 18XBRD TIE 12 (SUTURE) ×1 IMPLANT
SUT VIC AB 3-0 SH 8-18 (SUTURE) ×3 IMPLANT
SYR 50ML LL SCALE MARK (SYRINGE) ×2 IMPLANT
SYR CONTROL 10ML LL (SYRINGE) ×1 IMPLANT
TOWEL GREEN STERILE (TOWEL DISPOSABLE) ×2 IMPLANT
TOWEL GREEN STERILE FF (TOWEL DISPOSABLE) ×2 IMPLANT
TRACER MAGTRACE VIAL (MISCELLANEOUS) ×1 IMPLANT

## 2021-06-12 NOTE — Anesthesia Procedure Notes (Addendum)
Anesthesia Regional Block: Pectoralis block   Pre-Anesthetic Checklist: , timeout performed,  Correct Patient, Correct Site, Correct Laterality,  Correct Procedure, Correct Position, site marked,  Risks and benefits discussed,  Surgical consent,  Pre-op evaluation,  At surgeon's request and post-op pain management  Laterality: Right  Prep: chloraprep       Needles:  Injection technique: Single-shot  Needle Type: Echogenic Stimulator Needle     Needle Length: 5cm  Needle Gauge: 22     Additional Needles:   Procedures:, nerve stimulator,,, ultrasound used (permanent image in chart),,     Nerve Stimulator or Paresthesia:  Response: quadraceps contraction, 0.45 mA  Additional Responses:   Narrative:  Start time: 06/12/2021 7:10 AM End time: 06/12/2021 7:15 AM Injection made incrementally with aspirations every 5 mL.  Performed by: Personally  Anesthesiologist: Janeece Riggers, MD  Additional Notes: Functioning IV was confirmed and monitors were applied.  A 46mm 22ga Arrow echogenic stimulator needle was used. Sterile prep and drape,hand hygiene and sterile gloves were used. Ultrasound guidance: relevant anatomy identified, needle position confirmed, local anesthetic spread visualized around nerve(s)., vascular puncture avoided.  Image printed for medical record. Negative aspiration and negative test dose prior to incremental administration of local anesthetic. The patient tolerated the procedure well.

## 2021-06-12 NOTE — H&P (Signed)
REFERRING PHYSICIAN: Hester  PROVIDER: Georgianne Fick, MD  Care Team: Patient Care Team: Melina Modena, [place] as PCP - General (Family Medicine) Barry Dienes Ballard Russell, MD as Consulting Provider (Surgical Oncology) Truitt Merle, MD (Hematology and Oncology) Blair Promise, MD (Radiation Oncology)   MRN: [id number] DOB: [birth year]  Subjective   Chief Complaint: Breast Cancer   History of Present Illness: Brenda Waters is a 56 y.o. female who is seen today as an office consultation at the request of Almedia Balls for evaluation of Breast Cancer  Pt presents with a new diagnosis of left breast cancer 04/2021. She presented with a palpable mass. She has felt this for at least a year or more. She felt that it started to enlarge over the last few months. She also has developed left nipple retraction. She denies pain. She had diagnostic imaging that showed a 3.9 cm mass on mammogram and 4.4 cm on ultrasound. She then had core needle biopsy that showed a grade 2 invasive lobular carcinoma, +/+/-, Ki 67 15%.   She has no prior cancer history before this. She had a dad with oral cancer, a paternal grandmother with breast cancer, paternal great uncle with liver cancer, a paternal great uncle with throat cancer and a maternal half brother with leukemia.   She had menarche at age 72. Menopause was age 27. She hasn't used any HRT, but used hormonal contraception for around 5 years. She is a G2P2 with first child age 25. She had a colonoscopy in 1985. She never had a mammogram before this one.   Diagnostic mammogram:03/27/21 ACR Breast Density Category b: There are scattered areas of fibroglandular density.   FINDINGS: No suspicious masses, calcifications, or distortion are identified in the right breast.   There is a spiculated mass containing calcifications in the superior left breast measuring 3.4 cm mammographically. Just inferior and lateral to this mass is a group of  calcifications with associated density. There are calcifications between the mass and the increased density as well. Including the calcifications, the mass, and the density, the total size is 4.4 cm.   On physical exam, there is a lump in the left breast.   Targeted ultrasound is performed, showing a dominant mass in the left breast at 12 o'clock, 4 cm from the nipple measuring 3.9 x 2.9 x 1.2 cm. There is an adjacent satellite lesion at 1 o'clock, 4 cm from the nipple measuring 7 x 8 x 6 mm. The dominant mass and the satellite lesion are only 2 mm apart and do not need to be biopsied separately. No adenopathy identified. The axillary image demonstrates a normal node with an adjacent vessel.   IMPRESSION: Highly suspicious mass in the left breast at 12 o'clock, 4 cm from the nipple. Tiny satellite lesion 2 mm from the dominant mass. There is a group of calcifications with associated density lateral and inferior to the mass. There are calcifications between the density in the dominant mass as well.   RECOMMENDATION: Recommend ultrasound-guided biopsy of the 12 o'clock left breast mass. Recommend stereotactic biopsy of the density containing calcifications just inferior and lateral to the dominant mass.   I have discussed the findings and recommendations with the patient. If applicable, a reminder letter will be sent to the patient regarding the next appointment.   BI-RADS CATEGORY 5: Highly suggestive of malignancy.  Pathology core needle biopsy: 04/11/21 1. Breast, left, needle core biopsy, 12 o'clock, 4cmfn - INVASIVE MAMMARY CARCINOMA - MAMMARY CARCINOMA IN SITU -  SEE COMMENT 2. Breast, left, needle core biopsy, upper outer quadrant - INVASIVE MAMMARY CARCINOMA - SEE COMMENT 1. and 2. E-cadherin is NEGATIVE supporting lobular origin.  1. and 2. The biopsy material shows an infiltrative proliferation of cells arranged linearly and in small clusters. Based on the biopsy,  the carcinoma appears Nottingham grade 2 of 3 and measures 1.5 cm in greatest linear extent.  Receptors: Estrogen Receptor: 80%, POSITIVE, STRONG STAINING INTENSITY Progesterone Receptor: 70%, POSITIVE, STRONG STAINING INTENSITY Proliferation Marker Ki67: 15% GROUP 5: HER2 **NEGATIVE**  Review of Systems: A complete review of systems was obtained from the patient. I have reviewed this information and discussed as appropriate with the patient. See HPI as well for other ROS.  Review of Systems  All other systems reviewed and are negative.   Medical History: Past Medical History:  Diagnosis Date   Heartburn  Unknown date   History of cancer  Unknown date   Hypertension  Unknown date   Nasal congestion  Unknown date   Patient Active Problem List  Diagnosis   Malignant neoplasm of upper-outer quadrant of left breast in female, estrogen receptor positive (CMS-HCC)   Past Surgical History:  Procedure Laterality Date   CESAREAN SECTION N/A  (x2) Over 18yr ago    No Known Allergies  Current Outpatient Medications on File Prior to Visit  Medication Sig Dispense Refill   acetaminophen (TYLENOL) 325 MG tablet Take 2 tablets (650 mg total) by mouth every 6 (six) hours as needed   losartan-hydrochlorothiazide (HYZAAR) 50-12.5 mg tablet Take 1 tablet by mouth once daily   No current facility-administered medications on file prior to visit.   Family History  Problem Relation Age of Onset   Hyperlipidemia (Elevated cholesterol) Mother   Diabetes Sister   Myocardial Infarction (Heart attack) Brother   Coronary Artery Disease (Blocked arteries around heart) Brother   Breast cancer Maternal Aunt   Breast cancer Maternal Grandmother    Social History   Tobacco Use  Smoking Status Every Day   Packs/day: 1.00   Types: Cigarettes  Smokeless Tobacco Never    Social History   Socioeconomic History   Marital status: Unknown  Tobacco Use   Smoking status: Every Day   Packs/day: 1.00  Types: Cigarettes   Smokeless tobacco: Never  Vaping Use   Vaping Use: Never used  Substance and Sexual Activity   Alcohol use: Yes  Comment: Occasionally   Drug use: Never   Objective:   Vitals:   BP: 134/77  Pulse: 70  Resp: 18  Temp: 36.6 C (97.9 F)  SpO2: 99%  Weight: 59.5 kg (131 lb 2.8 oz)  Height: 160 cm (_0 )   Body mass index is 23.24 kg/m.  Gen: No acute distress. Well nourished and well groomed.  Neurological: Alert and oriented to person, place, and time. Coordination normal.  Head: Normocephalic and atraumatic.  Eyes: Conjunctivae are normal. Pupils are equal, round, and reactive to light. No scleral icterus.  Neck: Normal range of motion. Neck supple. No tracheal deviation or thyromegaly present.  Cardiovascular: Normal rate, regular rhythm, normal heart sounds and intact distal pulses. Exam reveals no gallop and no friction rub. No murmur heard. Breast: 4-5 cm mass central upper left breast. Mobile. Contour change. Nipple retraction on this side. Right breast with mild ptosis. No LAD. No other masses palpated.  Respiratory: Effort normal. No respiratory distress. No chest wall tenderness. Breath sounds normal. No wheezes, rales or rhonchi.  GI: Soft. Bowel sounds are normal.  The abdomen is soft and nontender. There is no rebound and no guarding.  Musculoskeletal: Normal range of motion. Extremities are nontender.  Lymphadenopathy: No cervical, preauricular, postauricular or axillary adenopathy is present Skin: Skin is warm and dry. No rash noted. No diaphoresis. No erythema. No pallor. No clubbing, cyanosis, or edema.  Psychiatric: Normal mood and affect. Behavior is normal. Judgment and thought content normal.   Labs CBC and CMET essentially nl.   Assessment and Plan:   Malignant neoplasm of upper-outer quadrant of left breast in female, estrogen receptor positive (CMS-HCC) Pt has a new diagnosis of cT2N0 left breast cancer. Given the  lobular nature of the tumor, will get an MR for planning purposes. I think she could potentially have a lumpectomy if she had no other MR findings, but she would have a pretty big defect superiorly. I will send her to plastic surgery to discuss reconstructive options. She will likely go with mastectomy.   Additional treatment will depend on final tumor size and whether nodes are positive. Oncotype or mammaprint will be sent. She may need XRT.   Final decision for surgery will follow MR and discussion with plastics. I discussed the risks of mastectomy including bleeding, infection, wound breakdown, healing issues, dissatisfaction with reconstruction/scar.       Milus Height, MD FACS Surgical Oncology, General Surgery, Trauma and [place] [facility]

## 2021-06-12 NOTE — Op Note (Signed)
Left Mastectomy with Sentinel Node Biopsy Procedure Note  Indications: This patient presents with history of left breast cancer   Pre-operative Diagnosis: left breast cancer, cT2N0M0, upper outer quadrant, grade 2 invasive lobular carcinoma, receptors +/+/-  Post-operative Diagnosis: same  Surgeon: Stark Klein   Assistant: Izola Price, RNFA  Anesthesia: General endotracheal anesthesia and pectoral block  ASA Class: 3  Procedure Details  The patient was seen in the Holding Room. The risks, benefits, complications, treatment options, and expected outcomes were discussed with the patient. The possibilities of reaction to medication, pulmonary aspiration, bleeding, infection, the need for additional procedures, failure to diagnose a condition, and creating a complication requiring transfusion or operation were discussed with the patient. The patient concurred with the proposed plan, giving informed consent.  The site of surgery properly noted/marked. The patient was taken to Operating Room # 2, identified as Brenda Waters and the procedure verified as Left Mastectomy and Sentinel Node Biopsy. A Time Out was held and the above information confirmed.    After induction of anesthesia, the left arm, breast, and chest were prepped and draped in standard fashion.  The MagTrace was injected into the subareolar space. The borders of the breast were identified and marked.  The incisions of the breast was drawn out to make sure incision lines were equidistant in length.    The superior incision was made with the #10 blade.  Mastectomy hooks were used to provide elevation of the skin edges, and the curved Mayo scissors were used to create the mastectomy flaps.  The dissection was taken to the fascia of the pectoralis major.  The penetrating vessels were clipped.  The superior flap was taken medially to the lateral sternal border, superiorly to the inferior border of the clavicle.  The inferior flap was similarly  created, inferiorly to the inframammary fold and laterally to the border of the latissimus.  The breast was taken off including the pectoralis fascia and the axillary tail marked.    Using the [facility] probe, axillary sentinel nodes were identified.  Three deep level 2 axillary sentinel nodes were removed and submitted to pathology.  The findings are below.  The lymphovascular channels were clipped with metal clips.        The wound was irrigated. One 19 Blake drain was placed laterally.   Hemostasis was achieved with cautery.  The wound was irrigated and closed with a 3-0 Vicryl deep dermal interrupted sutures and 4-0 Vicryl subcuticular closure in layers.    Sterile dressings were applied. At the end of the operation, all sponge, instrument, and needle counts were correct.  Findings: grossly clear surgical margins, highest cps 3500, background 15  Estimated Blood Loss: 30 mL          Drains: 19 Fr blake drain in left chest wall                Specimens: left breast and three left axillary sentinel nodes         Complications:  None; patient tolerated the procedure well.         Disposition: PACU - hemodynamically stable.         Condition: stable

## 2021-06-12 NOTE — Anesthesia Postprocedure Evaluation (Signed)
Anesthesia Post Note  Patient: Brenda Waters  Procedure(s) Performed: LEFT MASTECTOMY WITH SENTINEL LYMPH NODE BIOPSY (Left: Breast)     Patient location during evaluation: PACU Anesthesia Type: Regional and General Level of consciousness: awake and alert Pain management: pain level controlled Vital Signs Assessment: post-procedure vital signs reviewed and stable Respiratory status: spontaneous breathing, nonlabored ventilation, respiratory function stable and patient connected to nasal cannula oxygen Cardiovascular status: blood pressure returned to baseline and stable Postop Assessment: no apparent nausea or vomiting Anesthetic complications: no   No notable events documented.  Last Vitals:  Vitals:   06/12/21 1302 06/12/21 1330  BP: 115/75 115/80  Pulse: 79 84  Resp: 16 20  Temp:  (!) 36.3 C  SpO2: 96% 95%    Last Pain:  Vitals:   06/12/21 1330  TempSrc:   PainSc: 2                  Yevette Knust

## 2021-06-12 NOTE — Transfer of Care (Signed)
Immediate Anesthesia Transfer of Care Note  Patient: Brenda Waters  Procedure(s) Performed: LEFT MASTECTOMY WITH SENTINEL LYMPH NODE BIOPSY (Left: Breast)  Patient Location: PACU  Anesthesia Type:General and Regional  Level of Consciousness: awake and alert   Airway & Oxygen Therapy: Patient Spontanous Breathing  Post-op Assessment: Report given to RN and Post -op Vital signs reviewed and stable  Post vital signs: Reviewed and stable  Last Vitals:  Vitals Value Taken Time  BP 130/87 06/12/21 1001  Temp    Pulse 88 06/12/21 1003  Resp 20 06/12/21 1003  SpO2 96 % 06/12/21 1003  Vitals shown include unvalidated device data.  Last Pain:  Vitals:   06/12/21 0617  TempSrc:   PainSc: 0-No pain         Complications: No notable events documented.

## 2021-06-12 NOTE — Anesthesia Procedure Notes (Signed)
Procedure Name: LMA Insertion Date/Time: 06/12/2021 7:58 AM Performed by: Lorie Phenix, CRNA Pre-anesthesia Checklist: Patient identified, Emergency Drugs available, Suction available and Patient being monitored Patient Re-evaluated:Patient Re-evaluated prior to induction Oxygen Delivery Method: Circle System Utilized Preoxygenation: Pre-oxygenation with 100% oxygen Induction Type: IV induction Ventilation: Mask ventilation without difficulty LMA: LMA inserted LMA Size: 4.0 Number of attempts: 1 Placement Confirmation: positive ETCO2 Tube secured with: Tape Dental Injury: Teeth and Oropharynx as per pre-operative assessment

## 2021-06-12 NOTE — Interval H&P Note (Signed)
History and Physical Interval Note:  06/12/2021 7:38 AM  Brenda Waters  has presented today for surgery, with the diagnosis of LEFT BREAST CANCER.  The various methods of treatment have been discussed with the patient and family. After consideration of risks, benefits and other options for treatment, the patient has consented to  Procedure(s): LEFT MASTECTOMY WITH SENTINEL LYMPH NODE BIOPSY (Left) as a surgical intervention.  The patient's history has been reviewed, patient examined, no change in status, stable for surgery.  I have reviewed the patient's chart and labs.  Questions were answered to the patient's satisfaction.     Stark Klein

## 2021-06-12 NOTE — H&P (View-Only) (Signed)
REFERRING PHYSICIAN: Hester  PROVIDER: Georgianne Fick, MD  Care Team: Patient Care Team: Melina Modena, [place] as PCP - General (Family Medicine) Barry Dienes Ballard Russell, MD as Consulting Provider (Surgical Oncology) Truitt Merle, MD (Hematology and Oncology) Blair Promise, MD (Radiation Oncology)   MRN: [id number] DOB: [birth year]  Subjective   Chief Complaint: Breast Cancer   History of Present Illness: Brenda Waters is a 56 y.o. female who is seen today as an office consultation at the request of Almedia Balls for evaluation of Breast Cancer  Pt presents with a new diagnosis of left breast cancer 04/2021. She presented with a palpable mass. She has felt this for at least a year or more. She felt that it started to enlarge over the last few months. She also has developed left nipple retraction. She denies pain. She had diagnostic imaging that showed a 3.9 cm mass on mammogram and 4.4 cm on ultrasound. She then had core needle biopsy that showed a grade 2 invasive lobular carcinoma, +/+/-, Ki 67 15%.   She has no prior cancer history before this. She had a dad with oral cancer, a paternal grandmother with breast cancer, paternal great uncle with liver cancer, a paternal great uncle with throat cancer and a maternal half brother with leukemia.   She had menarche at age 72. Menopause was age 27. She hasn't used any HRT, but used hormonal contraception for around 5 years. She is a G2P2 with first child age 25. She had a colonoscopy in 1985. She never had a mammogram before this one.   Diagnostic mammogram:03/27/21 ACR Breast Density Category b: There are scattered areas of fibroglandular density.   FINDINGS: No suspicious masses, calcifications, or distortion are identified in the right breast.   There is a spiculated mass containing calcifications in the superior left breast measuring 3.4 cm mammographically. Just inferior and lateral to this mass is a group of  calcifications with associated density. There are calcifications between the mass and the increased density as well. Including the calcifications, the mass, and the density, the total size is 4.4 cm.   On physical exam, there is a lump in the left breast.   Targeted ultrasound is performed, showing a dominant mass in the left breast at 12 o'clock, 4 cm from the nipple measuring 3.9 x 2.9 x 1.2 cm. There is an adjacent satellite lesion at 1 o'clock, 4 cm from the nipple measuring 7 x 8 x 6 mm. The dominant mass and the satellite lesion are only 2 mm apart and do not need to be biopsied separately. No adenopathy identified. The axillary image demonstrates a normal node with an adjacent vessel.   IMPRESSION: Highly suspicious mass in the left breast at 12 o'clock, 4 cm from the nipple. Tiny satellite lesion 2 mm from the dominant mass. There is a group of calcifications with associated density lateral and inferior to the mass. There are calcifications between the density in the dominant mass as well.   RECOMMENDATION: Recommend ultrasound-guided biopsy of the 12 o'clock left breast mass. Recommend stereotactic biopsy of the density containing calcifications just inferior and lateral to the dominant mass.   I have discussed the findings and recommendations with the patient. If applicable, a reminder letter will be sent to the patient regarding the next appointment.   BI-RADS CATEGORY 5: Highly suggestive of malignancy.  Pathology core needle biopsy: 04/11/21 1. Breast, left, needle core biopsy, 12 o'clock, 4cmfn - INVASIVE MAMMARY CARCINOMA - MAMMARY CARCINOMA IN SITU -  SEE COMMENT 2. Breast, left, needle core biopsy, upper outer quadrant - INVASIVE MAMMARY CARCINOMA - SEE COMMENT 1. and 2. E-cadherin is NEGATIVE supporting lobular origin.  1. and 2. The biopsy material shows an infiltrative proliferation of cells arranged linearly and in small clusters. Based on the biopsy,  the carcinoma appears Nottingham grade 2 of 3 and measures 1.5 cm in greatest linear extent.  Receptors: Estrogen Receptor: 80%, POSITIVE, STRONG STAINING INTENSITY Progesterone Receptor: 70%, POSITIVE, STRONG STAINING INTENSITY Proliferation Marker Ki67: 15% GROUP 5: HER2 **NEGATIVE**  Review of Systems: A complete review of systems was obtained from the patient. I have reviewed this information and discussed as appropriate with the patient. See HPI as well for other ROS.  Review of Systems  All other systems reviewed and are negative.   Medical History: Past Medical History:  Diagnosis Date   Heartburn  Unknown date   History of cancer  Unknown date   Hypertension  Unknown date   Nasal congestion  Unknown date   Patient Active Problem List  Diagnosis   Malignant neoplasm of upper-outer quadrant of left breast in female, estrogen receptor positive (CMS-HCC)   Past Surgical History:  Procedure Laterality Date   CESAREAN SECTION N/A  (x2) Over 18yr ago    No Known Allergies  Current Outpatient Medications on File Prior to Visit  Medication Sig Dispense Refill   acetaminophen (TYLENOL) 325 MG tablet Take 2 tablets (650 mg total) by mouth every 6 (six) hours as needed   losartan-hydrochlorothiazide (HYZAAR) 50-12.5 mg tablet Take 1 tablet by mouth once daily   No current facility-administered medications on file prior to visit.   Family History  Problem Relation Age of Onset   Hyperlipidemia (Elevated cholesterol) Mother   Diabetes Sister   Myocardial Infarction (Heart attack) Brother   Coronary Artery Disease (Blocked arteries around heart) Brother   Breast cancer Maternal Aunt   Breast cancer Maternal Grandmother    Social History   Tobacco Use  Smoking Status Every Day   Packs/day: 1.00   Types: Cigarettes  Smokeless Tobacco Never    Social History   Socioeconomic History   Marital status: Unknown  Tobacco Use   Smoking status: Every Day   Packs/day: 1.00  Types: Cigarettes   Smokeless tobacco: Never  Vaping Use   Vaping Use: Never used  Substance and Sexual Activity   Alcohol use: Yes  Comment: Occasionally   Drug use: Never   Objective:   Vitals:   BP: 134/77  Pulse: 70  Resp: 18  Temp: 36.6 C (97.9 F)  SpO2: 99%  Weight: 59.5 kg (131 lb 2.8 oz)  Height: 160 cm (_0 )   Body mass index is 23.24 kg/m.  Gen: No acute distress. Well nourished and well groomed.  Neurological: Alert and oriented to person, place, and time. Coordination normal.  Head: Normocephalic and atraumatic.  Eyes: Conjunctivae are normal. Pupils are equal, round, and reactive to light. No scleral icterus.  Neck: Normal range of motion. Neck supple. No tracheal deviation or thyromegaly present.  Cardiovascular: Normal rate, regular rhythm, normal heart sounds and intact distal pulses. Exam reveals no gallop and no friction rub. No murmur heard. Breast: 4-5 cm mass central upper left breast. Mobile. Contour change. Nipple retraction on this side. Right breast with mild ptosis. No LAD. No other masses palpated.  Respiratory: Effort normal. No respiratory distress. No chest wall tenderness. Breath sounds normal. No wheezes, rales or rhonchi.  GI: Soft. Bowel sounds are normal.  The abdomen is soft and nontender. There is no rebound and no guarding.  Musculoskeletal: Normal range of motion. Extremities are nontender.  Lymphadenopathy: No cervical, preauricular, postauricular or axillary adenopathy is present Skin: Skin is warm and dry. No rash noted. No diaphoresis. No erythema. No pallor. No clubbing, cyanosis, or edema.  Psychiatric: Normal mood and affect. Behavior is normal. Judgment and thought content normal.   Labs CBC and CMET essentially nl.   Assessment and Plan:   Malignant neoplasm of upper-outer quadrant of left breast in female, estrogen receptor positive (CMS-HCC) Pt has a new diagnosis of cT2N0 left breast cancer. Given the  lobular nature of the tumor, will get an MR for planning purposes. I think she could potentially have a lumpectomy if she had no other MR findings, but she would have a pretty big defect superiorly. I will send her to plastic surgery to discuss reconstructive options. She will likely go with mastectomy.   Additional treatment will depend on final tumor size and whether nodes are positive. Oncotype or mammaprint will be sent. She may need XRT.   Final decision for surgery will follow MR and discussion with plastics. I discussed the risks of mastectomy including bleeding, infection, wound breakdown, healing issues, dissatisfaction with reconstruction/scar.       Milus Height, MD FACS Surgical Oncology, General Surgery, Trauma and [place] [facility]

## 2021-06-13 ENCOUNTER — Encounter (HOSPITAL_COMMUNITY): Payer: Self-pay | Admitting: General Surgery

## 2021-06-13 DIAGNOSIS — R69 Illness, unspecified: Secondary | ICD-10-CM | POA: Diagnosis not present

## 2021-06-13 DIAGNOSIS — C773 Secondary and unspecified malignant neoplasm of axilla and upper limb lymph nodes: Secondary | ICD-10-CM | POA: Diagnosis not present

## 2021-06-13 DIAGNOSIS — Z803 Family history of malignant neoplasm of breast: Secondary | ICD-10-CM | POA: Diagnosis not present

## 2021-06-13 DIAGNOSIS — Z800 Family history of malignant neoplasm of digestive organs: Secondary | ICD-10-CM | POA: Diagnosis not present

## 2021-06-13 DIAGNOSIS — Z808 Family history of malignant neoplasm of other organs or systems: Secondary | ICD-10-CM | POA: Diagnosis not present

## 2021-06-13 DIAGNOSIS — I10 Essential (primary) hypertension: Secondary | ICD-10-CM | POA: Diagnosis not present

## 2021-06-13 DIAGNOSIS — C50412 Malignant neoplasm of upper-outer quadrant of left female breast: Secondary | ICD-10-CM | POA: Diagnosis not present

## 2021-06-13 DIAGNOSIS — K219 Gastro-esophageal reflux disease without esophagitis: Secondary | ICD-10-CM | POA: Diagnosis not present

## 2021-06-13 DIAGNOSIS — Z170 Estrogen receptor positive status [ER+]: Secondary | ICD-10-CM | POA: Diagnosis not present

## 2021-06-13 DIAGNOSIS — Z806 Family history of leukemia: Secondary | ICD-10-CM | POA: Diagnosis not present

## 2021-06-13 LAB — CBC
HCT: 29.1 % — ABNORMAL LOW (ref 36.0–46.0)
Hemoglobin: 10.2 g/dL — ABNORMAL LOW (ref 12.0–15.0)
MCH: 31.4 pg (ref 26.0–34.0)
MCHC: 35.1 g/dL (ref 30.0–36.0)
MCV: 89.5 fL (ref 80.0–100.0)
Platelets: 276 10*3/uL (ref 150–400)
RBC: 3.25 MIL/uL — ABNORMAL LOW (ref 3.87–5.11)
RDW: 12.9 % (ref 11.5–15.5)
WBC: 13.9 10*3/uL — ABNORMAL HIGH (ref 4.0–10.5)
nRBC: 0 % (ref 0.0–0.2)

## 2021-06-13 LAB — BASIC METABOLIC PANEL
Anion gap: 6 (ref 5–15)
BUN: 12 mg/dL (ref 6–20)
CO2: 24 mmol/L (ref 22–32)
Calcium: 8.6 mg/dL — ABNORMAL LOW (ref 8.9–10.3)
Chloride: 106 mmol/L (ref 98–111)
Creatinine, Ser: 0.88 mg/dL (ref 0.44–1.00)
GFR, Estimated: >60 mL/min (ref >60)
Glucose, Bld: 146 mg/dL — ABNORMAL HIGH (ref 70–99)
Potassium: 4 mmol/L (ref 3.5–5.1)
Sodium: 136 mmol/L (ref 135–145)

## 2021-06-13 MED ORDER — OXYCODONE HCL 5 MG PO TABS: 5.0000 mg | ORAL_TABLET | Freq: Four times a day (QID) | ORAL | 0 refills | Status: AC | PRN

## 2021-06-13 MED ORDER — GABAPENTIN 100 MG PO CAPS: 200.0000 mg | ORAL_CAPSULE | Freq: Two times a day (BID) | ORAL | 1 refills | Status: AC

## 2021-06-13 MED ORDER — IBUPROFEN 600 MG PO TABS
600.0000 mg | ORAL_TABLET | Freq: Three times a day (TID) | ORAL | 1 refills | Status: AC | PRN
Start: 2021-06-13 — End: ?

## 2021-06-13 MED ORDER — METHOCARBAMOL 500 MG PO TABS: 500.0000 mg | ORAL_TABLET | Freq: Four times a day (QID) | ORAL | 1 refills | Status: AC | PRN

## 2021-06-13 NOTE — Plan of Care (Signed)
  Problem: Education: Goal: Knowledge of General Education information will improve Description: Including pain rating scale, medication(s)/side effects and non-pharmacologic comfort measures Outcome: Progressing   Problem: Nutrition: Goal: Adequate nutrition will be maintained Outcome: Progressing   Problem: Coping: Goal: Level of anxiety will decrease Outcome: Progressing   Problem: Elimination: Goal: Will not experience complications related to bowel motility Outcome: Progressing   Problem: Pain Managment: Goal: General experience of comfort will improve Outcome: Progressing   Problem: Safety: Goal: Ability to remain free from injury will improve Outcome: Progressing   Problem: Skin Integrity: Goal: Risk for impaired skin integrity will decrease Outcome: Progressing   

## 2021-06-13 NOTE — Final Progress Note (Signed)
Discharge instructions given to patient and patient's husband.  Emphasized education on incision care, signs of infection to watch for, and JP care and maintenance.  Patient demonstrated knowledge on emptying and counting JP output.  Reminded to record all JP output.  Encouraged to call the doctor for questions.  Patient preferred to walk out of the unit with husband.  Discharged home.

## 2021-06-13 NOTE — Discharge Instructions (Signed)
[facility] [phone]  MASTECTOMY: POST OP INSTRUCTIONS  Always review your discharge instruction sheet given to you by the facility where your surgery was performed. IF YOU HAVE DISABILITY OR FAMILY LEAVE FORMS, YOU MUST BRING THEM TO THE OFFICE FOR PROCESSING.   DO NOT GIVE THEM TO YOUR DOCTOR. A prescription for pain medication may be given to you upon discharge.  Take your pain medication as prescribed, if needed.  If narcotic pain medicine is not needed, then you may take acetaminophen (Tylenol) or ibuprofen (Advil) as needed. Take your usually prescribed medications unless otherwise directed. If you need a refill on your pain medication, please contact your pharmacy.  They will contact our office to request authorization.  Prescriptions will not be filled after 5pm or on week-ends. You should follow a light diet the first few days after arrival home, such as soup and crackers, etc.  Resume your normal diet the day after surgery. Most patients will experience some swelling and bruising on the chest and underarm.  Ice packs will help.  Swelling and bruising can take several days to resolve.  It is common to experience some constipation if taking pain medication after surgery.  Increasing fluid intake and taking a stool softener (such as Colace) will usually help or prevent this problem from occurring.  A mild laxative (Milk of Magnesia or Miralax) should be taken according to package instructions if there are no bowel movements after 48 hours. Unless discharge instructions indicate otherwise, leave your bandage dry and in place until your next appointment in 3-5 days.  You may take a limited sponge bath.  No tube baths or showers until the drains are removed.  You may have steri-strips (small skin tapes) in place directly over the incision.  These strips should be left on the skin for 7-10 days.  If your surgeon used skin glue on the incision, you may shower in 24 hours.   The glue will flake off over the next 2-3 weeks.  Any sutures or staples will be removed at the office during your follow-up visit. DRAINS:  If you have drains in place, it is important to keep a list of the amount of drainage produced each day in your drains.  Before leaving the hospital, you should be instructed on drain care.  Call our office if you have any questions about your drains. ACTIVITIES:  You may resume regular (light) daily activities beginning the next day--such as daily self-care, walking, climbing stairs--gradually increasing activities as tolerated.  You may have sexual intercourse when it is comfortable.  Refrain from any heavy lifting or straining until approved by your doctor. You may drive when you are no longer taking prescription pain medication, you can comfortably wear a seatbelt, and you can safely maneuver your car and apply brakes. RETURN TO WORK:  __________________________________________________________ You should see your doctor in the office for a follow-up appointment approximately 3-5 days after your surgery.  Your doctor's nurse will typically make your follow-up appointment when she calls you with your pathology report.  Expect your pathology report 2-3 business days after your surgery.  You may call to check if you do not hear from us after three days.   OTHER INSTRUCTIONS: ______________________________________________________________________________________________ ____________________________________________________________________________________________ WHEN TO CALL YOUR DOCTOR: Fever over 101.0 Nausea and/or vomiting Extreme swelling or bruising Continued bleeding from incision. Increased pain, redness, or drainage from the incision. The clinic staff is available to answer your questions during regular business hours.  Please don't hesitate   to call and ask to speak to one of the nurses for clinical concerns.  If you have a medical emergency, go to the  nearest emergency room or call 911.  A surgeon from [facility] is always on call at the hospital. [address], [address], [place], [place]  [address] ? [address] [address], [place], [place]   [address] [phone] ? [phone] ? FAX [phone] Web site: [email or web address]  

## 2021-06-13 NOTE — Discharge Summary (Signed)
Physician Discharge Summary  Patient ID: Brenda Waters MRN: [id number] DOB/AGE: 1966-02-25 56 y.o.  Admit date: 06/12/2021 Discharge date: 06/13/2021  Admission Diagnoses: Patient Active Problem List   Diagnosis Date Noted   Breast cancer of upper-outer quadrant of left female breast ([place]) 06/12/2021   Genetic testing 04/23/2021   Malignant neoplasm of upper-outer quadrant of left breast in female, estrogen receptor positive ([place]) 04/21/2021    Discharge Diagnoses:  Principal Problem:   Breast cancer of upper-outer quadrant of left female breast [facility])   Discharged Condition: stable  Hospital Course:  Patient was admitted to the floor following left mastectomy and sentinel node biopsy 06/12/21.  She did well overnight other than high drain output.  She used minimal additional medication other than standing tylenol and gabapentin.  She was ambulatory and able to eat without n/v.  She understands drain care.    Consults: None  Significant Diagnostic Studies: labs: HCT 29.1 prior to d/c.   Treatments: surgery: see above.    Discharge Exam: Blood pressure 109/63, pulse 77, temperature 98 F (36.7 C), temperature source Oral, resp. rate 16, height 5\' 1"  (1.549 m), weight 59 kg, SpO2 100 %. General appearance: alert, cooperative, and no distress Resp: breathing comfortably Chest wall: anticipated small amount of left chest wall discomfort.  Small hematoma laterally, drain functioning well with serosang output.    Disposition: Discharge disposition: 01-Home or Self Care       Discharge Instructions     Call MD for:  difficulty breathing, headache or visual disturbances   Complete by: As directed    Call MD for:  hives   Complete by: As directed    Call MD for:  persistant nausea and vomiting   Complete by: As directed    Call MD for:  redness, tenderness, or signs of infection (pain, swelling, redness, odor or green/yellow discharge around incision site)   Complete by: As  directed    Call MD for:  severe uncontrolled pain   Complete by: As directed    Call MD for:  temperature >100.4   Complete by: As directed    Diet - low sodium heart healthy   Complete by: As directed    Increase activity slowly   Complete by: As directed       Allergies as of 06/13/2021   No Known Allergies      Medication List     TAKE these medications    acetaminophen 325 MG tablet Commonly known as: TYLENOL Take 650 mg by mouth every 6 (six) hours as needed for moderate pain.   Apple Cider Vinegar 500 MG Tabs Take 500 mg by mouth in the morning and at bedtime.   ciclopirox 8 % solution Commonly known as: PENLAC Apply 1 drop topically at bedtime.   fluticasone 50 MCG/ACT nasal spray Commonly known as: FLONASE Place 2 sprays into both nostrils at bedtime.   gabapentin 100 MG capsule Commonly known as: NEURONTIN Take 2 capsules (200 mg total) by mouth 2 (two) times daily.   ibuprofen 600 MG tablet Commonly known as: ADVIL Take 1 tablet (600 mg total) by mouth every 8 (eight) hours as needed for mild pain or moderate pain.   losartan-hydrochlorothiazide 50-12.5 MG tablet Commonly known as: HYZAAR Take 1 tablet by mouth daily.   methocarbamol 500 MG tablet Commonly known as: ROBAXIN Take 1 tablet (500 mg total) by mouth every 6 (six) hours as needed for muscle spasms.   multivitamin with minerals Tabs tablet Take  1 tablet by mouth daily.   oxyCODONE 5 MG immediate release tablet Commonly known as: Oxy IR/ROXICODONE Take 1 tablet (5 mg total) by mouth every 6 (six) hours as needed for severe pain or breakthrough pain.        Follow-up Information     Stark Klein, MD Follow up in 2 week(s).   Specialty: General Surgery Contact information: [address] [place] [address] [phone]                 Signed: Stark Klein 06/13/2021, 9:45 AM

## 2021-06-17 LAB — SURGICAL PATHOLOGY

## 2021-06-18 ENCOUNTER — Other Ambulatory Visit: Payer: Self-pay | Admitting: General Surgery

## 2021-06-18 ENCOUNTER — Telehealth: Payer: Self-pay | Admitting: General Surgery

## 2021-06-18 NOTE — Telephone Encounter (Signed)
Discussed path with patient. Will plan axillary lymph node dissection.

## 2021-06-19 ENCOUNTER — Encounter: Payer: Self-pay | Admitting: *Deleted

## 2021-06-23 ENCOUNTER — Encounter: Payer: Self-pay | Admitting: *Deleted

## 2021-06-27 ENCOUNTER — Telehealth: Payer: Self-pay | Admitting: *Deleted

## 2021-06-27 NOTE — Telephone Encounter (Signed)
Pt called regarding receiving knitted knockers. Msg sent to pt and family support services. Called pt back and informed msg sent and will receive a call to get her set up. No further needs or questions voiced at this time.

## 2021-06-30 ENCOUNTER — Other Ambulatory Visit: Payer: Self-pay

## 2021-06-30 ENCOUNTER — Encounter (HOSPITAL_COMMUNITY): Payer: Self-pay | Admitting: General Surgery

## 2021-06-30 NOTE — Progress Notes (Signed)
DUE TO COVID-19 ONLY ONE VISITOR IS ALLOWED TO COME WITH YOU AND STAY IN THE WAITING ROOM ONLY DURING PRE OP AND PROCEDURE DAY OF SURGERY.   Two VISITORS MAY VISIT WITH YOU AFTER SURGERY IN YOUR PRIVATE ROOM DURING VISITING HOURS ONLY!  PCP - Almedia Balls, NP Cardiologist - n/a  Chest x-ray - n/a EKG - 06/09/21 Stress Test - n/a ECHO - n/a Cardiac Cath - n/a  ICD Pacemaker/Loop - n/a  Sleep Study -  n/a CPAP - none  Anesthesia review: Yes  STOP now taking any Aspirin (unless otherwise instructed by your surgeon), Aleve, Naproxen, Ibuprofen, Motrin, Advil, Goody's, BC's, all herbal medications, fish oil, and all vitamins.   Coronavirus Screening Covid test is scheduled on DOS. Do you have any of the following symptoms:  Cough yes/no: No Fever (>100.4F)  yes/no: No Runny nose yes/no: No Sore throat yes/no: No Difficulty breathing/shortness of breath  yes/no: No  Have you traveled in the last 14 days and where? yes/no: No  Patient verbalized understanding of instructions that were given via phone.

## 2021-07-01 NOTE — Anesthesia Preprocedure Evaluation (Addendum)
Anesthesia Evaluation  Patient identified by MRN, date of birth, ID band Patient awake    Reviewed: Allergy & Precautions, NPO status , Patient's Chart, lab work & pertinent test results  History of Anesthesia Complications Negative for: history of anesthetic complications  Airway Mallampati: II  TM Distance: >3 FB Neck ROM: Full    Dental  (+) Teeth Intact, Dental Advisory Given, Caps,    Pulmonary Current SmokerPatient did not abstain from smoking.,    Pulmonary exam normal breath sounds clear to auscultation       Cardiovascular hypertension, Pt. on medications (-) angina(-) CAD and (-) Past MI Normal cardiovascular exam+ Valvular Problems/Murmurs MVP  Rhythm:Regular Rate:Normal     Neuro/Psych negative neurological ROS  negative psych ROS   GI/Hepatic Neg liver ROS, GERD  ,  Endo/Other  negative endocrine ROS  Renal/GU negative Renal ROS     Musculoskeletal negative musculoskeletal ROS (+)   Abdominal   Peds  Hematology negative hematology ROS (+)   Anesthesia Other Findings Day of surgery medications reviewed with the patient.  LEFT BREAST CANCER  Reproductive/Obstetrics                            Anesthesia Physical Anesthesia Plan  ASA: 2  Anesthesia Plan: General   Post-op Pain Management: Tylenol PO (pre-op)* and Regional block*   Induction: Intravenous  PONV Risk Score and Plan: Midazolam, Dexamethasone and Ondansetron  Airway Management Planned: LMA  Additional Equipment:   Intra-op Plan:   Post-operative Plan: Extubation in OR  Informed Consent: I have reviewed the patients History and Physical, chart, labs and discussed the procedure including the risks, benefits and alternatives for the proposed anesthesia with the patient or authorized representative who has indicated his/her understanding and acceptance.     Dental advisory given  Plan Discussed with:  CRNA  Anesthesia Plan Comments: (PAT note written 07/01/2021 by Myra Gianotti, PA-C. )       Anesthesia Quick Evaluation

## 2021-07-01 NOTE — Progress Notes (Signed)
Anesthesia Chart Review:  Case: [id number] Date/Time: 07/02/21 1415   Procedure: LEFT AXILLARY LYMPH NODE DISSECTION (Left)   Anesthesia type: General   Pre-op diagnosis: LEFT BREAST CANCER   Location: [facility] OR ROOM 02 / [facility]   Surgeons: Stark Klein, MD       DISCUSSION: Patient is a 56 year old female scheduled for the above procedure. She had 2 positive LN from 06/12/21 left mastectomy.  History includes smoking, HTN, GERD, murmur/MVP, left breast cancer  (diagnosed 04/11/21; s/p left mastectomy, + sentinel LN 06/12/21 for metastatic lobular carcinoma).   Reported history of MVP but denied prior echo. "No murmur" and "no edema" documented in 04/23/21 exams by Dr. Barry Dienes and Dr. Burr Medico. 06/09/21 EKG showed SR, poor r wave progression. 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    She is a same day work-up. Anesthesia team to evaluate on the day of surgery.     VS: Ht 5\' 1"  (1.549 m)    Wt 59 kg    BMI 24.56 kg/m  BP Readings from Last 3 Encounters:  06/13/21 109/63  06/09/21 133/82  04/30/21 136/83   Pulse Readings from Last 3 Encounters:  06/13/21 77  06/09/21 82  04/30/21 78     PROVIDERS: Almedia Balls, NP is PCP  Truitt Merle, MD is Edson Snowball, MD is RAD-ONC    LABS: Last lab results from 06/13/21 include: Lab Results  Component Value Date   WBC 13.9 (H) 06/13/2021   HGB 10.2 (L) 06/13/2021   HCT 29.1 (L) 06/13/2021   PLT 276 06/13/2021   GLUCOSE 146 (H) 06/13/2021   ALT 22 04/23/2021   AST 17 04/23/2021   NA 136 06/13/2021   K 4.0 06/13/2021   CL 106 06/13/2021   CREATININE 0.88 06/13/2021   BUN 12 06/13/2021   CO2 24 06/13/2021     EKG: 06/09/21: Normal sinus rhythm Possible Anterior infarct , age undetermined Abnormal ECG No previous ECGs available Confirmed by Oswaldo Milian [phone]) on 06/09/2021 11:19:42 PM    CV: N/A   Past Medical History:  Diagnosis Date   Breast cancer ([place])    Left   GERD  (gastroesophageal reflux disease)    diet controlled   Heart murmur    no current problems   Hypertension    Mitral valve prolapse     Past Surgical History:  Procedure Laterality Date   CESAREAN SECTION     x 2 in 1991 and [place] Left 06/12/2021   Procedure: LEFT MASTECTOMY WITH SENTINEL LYMPH NODE BIOPSY;  Surgeon: Stark Klein, MD;  Location: [facility];  Service: General;  Laterality: Left;   [place]: No current facility-administered medications for this encounter.    acetaminophen (TYLENOL) 325 MG tablet   Apple Cider Vinegar 500 MG TABS   ciclopirox (PENLAC) 8 % solution   fluticasone (FLONASE) 50 MCG/ACT nasal spray   gabapentin (NEURONTIN) 100 MG capsule   ibuprofen (ADVIL) 600 MG tablet   losartan-hydrochlorothiazide (HYZAAR) 50-12.5 MG tablet   methocarbamol (ROBAXIN) 500 MG tablet   Multiple Vitamin (MULTIVITAMIN WITH MINERALS) TABS tablet   oxyCODONE (OXY IR/ROXICODONE) 5 MG immediate release tablet     Myra Gianotti, PA-C Surgical Short Stay/Anesthesiology [facility] Phone [phone] [facility] Phone [phone] 07/01/2021 12:53 PM

## 2021-07-02 ENCOUNTER — Encounter (HOSPITAL_COMMUNITY): Payer: Self-pay | Admitting: General Surgery

## 2021-07-02 ENCOUNTER — Ambulatory Visit (HOSPITAL_COMMUNITY)
Admission: RE | Admit: 2021-07-02 | Discharge: 2021-07-02 | Disposition: A | Discharge status: Home / Self Care | Payer: 59 | Source: Ambulatory Visit | Attending: General Surgery | Admitting: General Surgery

## 2021-07-02 ENCOUNTER — Ambulatory Visit (HOSPITAL_COMMUNITY): Payer: 59 | Admitting: Vascular Surgery

## 2021-07-02 ENCOUNTER — Other Ambulatory Visit: Payer: Self-pay

## 2021-07-02 ENCOUNTER — Encounter (HOSPITAL_COMMUNITY)
Admission: RE | Disposition: A | Discharge status: Home / Self Care | Payer: Self-pay | Source: Ambulatory Visit | Attending: General Surgery

## 2021-07-02 ENCOUNTER — Ambulatory Visit (HOSPITAL_BASED_OUTPATIENT_CLINIC_OR_DEPARTMENT_OTHER): Payer: 59 | Admitting: Vascular Surgery

## 2021-07-02 DIAGNOSIS — C50912 Malignant neoplasm of unspecified site of left female breast: Secondary | ICD-10-CM | POA: Diagnosis not present

## 2021-07-02 DIAGNOSIS — I341 Nonrheumatic mitral (valve) prolapse: Secondary | ICD-10-CM | POA: Insufficient documentation

## 2021-07-02 DIAGNOSIS — F17210 Nicotine dependence, cigarettes, uncomplicated: Secondary | ICD-10-CM | POA: Diagnosis not present

## 2021-07-02 DIAGNOSIS — I10 Essential (primary) hypertension: Secondary | ICD-10-CM | POA: Insufficient documentation

## 2021-07-02 DIAGNOSIS — Z9012 Acquired absence of left breast and nipple: Secondary | ICD-10-CM | POA: Diagnosis not present

## 2021-07-02 DIAGNOSIS — Z806 Family history of leukemia: Secondary | ICD-10-CM | POA: Diagnosis not present

## 2021-07-02 DIAGNOSIS — R69 Illness, unspecified: Secondary | ICD-10-CM | POA: Diagnosis not present

## 2021-07-02 DIAGNOSIS — Z803 Family history of malignant neoplasm of breast: Secondary | ICD-10-CM | POA: Insufficient documentation

## 2021-07-02 DIAGNOSIS — Z808 Family history of malignant neoplasm of other organs or systems: Secondary | ICD-10-CM | POA: Insufficient documentation

## 2021-07-02 DIAGNOSIS — Z170 Estrogen receptor positive status [ER+]: Secondary | ICD-10-CM

## 2021-07-02 DIAGNOSIS — Z800 Family history of malignant neoplasm of digestive organs: Secondary | ICD-10-CM | POA: Insufficient documentation

## 2021-07-02 DIAGNOSIS — Z20822 Contact with and (suspected) exposure to covid-19: Secondary | ICD-10-CM | POA: Insufficient documentation

## 2021-07-02 DIAGNOSIS — C50412 Malignant neoplasm of upper-outer quadrant of left female breast: Secondary | ICD-10-CM | POA: Diagnosis not present

## 2021-07-02 DIAGNOSIS — Z853 Personal history of malignant neoplasm of breast: Secondary | ICD-10-CM | POA: Diagnosis not present

## 2021-07-02 HISTORY — PX: NODE DISSECTION: SHX5269

## 2021-07-02 LAB — SARS CORONAVIRUS 2 BY RT PCR (HOSPITAL ORDER, PERFORMED IN ~~LOC~~ HOSPITAL LAB): SARS Coronavirus 2: NEGATIVE

## 2021-07-02 SURGERY — NODE DISSECTION
Anesthesia: General | Site: Axilla | Laterality: Left

## 2021-07-02 MED ORDER — LIDOCAINE 2% (20 MG/ML) 5 ML SYRINGE
INTRAMUSCULAR | Status: DC | PRN
  Administered 2021-07-02: 100 mg via INTRAVENOUS

## 2021-07-02 MED ORDER — CHLORHEXIDINE GLUCONATE CLOTH 2 % EX PADS: 6.0000 | MEDICATED_PAD | Freq: Once | CUTANEOUS | Status: DC

## 2021-07-02 MED ORDER — DEXAMETHASONE SODIUM PHOSPHATE 10 MG/ML IJ SOLN
INTRAMUSCULAR | Status: DC | PRN
  Administered 2021-07-02: 10 mg

## 2021-07-02 MED ORDER — ACETAMINOPHEN 500 MG PO TABS: 1000.0000 mg | ORAL_TABLET | ORAL | Status: DC

## 2021-07-02 MED ORDER — GABAPENTIN 300 MG PO CAPS: 300.0000 mg | ORAL_CAPSULE | ORAL | Status: AC

## 2021-07-02 MED ORDER — PROPOFOL 10 MG/ML IV BOLUS
INTRAVENOUS | Status: AC
  Filled 2021-07-02: qty 20

## 2021-07-02 MED ORDER — BUPIVACAINE-EPINEPHRINE (PF) 0.5% -1:200000 IJ SOLN
INTRAMUSCULAR | Status: DC | PRN
  Administered 2021-07-02: 30 mL via PERINEURAL

## 2021-07-02 MED ORDER — ACETAMINOPHEN 500 MG PO TABS
ORAL_TABLET | ORAL | Status: AC
  Filled 2021-07-02: qty 2

## 2021-07-02 MED ORDER — FENTANYL CITRATE (PF) 100 MCG/2ML IJ SOLN: 50.0000 ug | Freq: Once | INTRAMUSCULAR | Status: AC

## 2021-07-02 MED ORDER — FENTANYL CITRATE (PF) 100 MCG/2ML IJ SOLN
INTRAMUSCULAR | Status: AC
  Administered 2021-07-02: 50 ug via INTRAVENOUS
  Filled 2021-07-02: qty 2

## 2021-07-02 MED ORDER — CEFAZOLIN SODIUM-DEXTROSE 2-4 GM/100ML-% IV SOLN
INTRAVENOUS | Status: AC
  Filled 2021-07-02: qty 100

## 2021-07-02 MED ORDER — MIDAZOLAM HCL 2 MG/2ML IJ SOLN
INTRAMUSCULAR | Status: AC
  Filled 2021-07-02: qty 2

## 2021-07-02 MED ORDER — LACTATED RINGERS IV SOLN: INTRAVENOUS | Status: DC

## 2021-07-02 MED ORDER — CHLORHEXIDINE GLUCONATE 0.12 % MT SOLN
OROMUCOSAL | Status: AC
  Administered 2021-07-02: 15 mL
  Filled 2021-07-02: qty 15

## 2021-07-02 MED ORDER — LIDOCAINE 2% (20 MG/ML) 5 ML SYRINGE
INTRAMUSCULAR | Status: AC
  Filled 2021-07-02: qty 5

## 2021-07-02 MED ORDER — PROPOFOL 10 MG/ML IV BOLUS
INTRAVENOUS | Status: DC | PRN
  Administered 2021-07-02: 160 mg via INTRAVENOUS

## 2021-07-02 MED ORDER — DEXAMETHASONE SODIUM PHOSPHATE 10 MG/ML IJ SOLN
INTRAMUSCULAR | Status: DC | PRN
Start: 2021-07-02 — End: 2021-07-02
  Administered 2021-07-02: 10 mg via INTRAVENOUS

## 2021-07-02 MED ORDER — MIDAZOLAM HCL 2 MG/2ML IJ SOLN: 2.0000 mg | Freq: Once | INTRAMUSCULAR | Status: AC

## 2021-07-02 MED ORDER — LIDOCAINE HCL 1 % IJ SOLN
INTRAMUSCULAR | Status: DC | PRN
  Administered 2021-07-02: 10 mL

## 2021-07-02 MED ORDER — PHENYLEPHRINE 40 MCG/ML (10ML) SYRINGE FOR IV PUSH (FOR BLOOD PRESSURE SUPPORT)
PREFILLED_SYRINGE | INTRAVENOUS | Status: DC | PRN
  Administered 2021-07-02 (×4): 80 ug via INTRAVENOUS

## 2021-07-02 MED ORDER — LIDOCAINE HCL (PF) 1 % IJ SOLN
INTRAMUSCULAR | Status: AC
  Filled 2021-07-02: qty 30

## 2021-07-02 MED ORDER — GABAPENTIN 300 MG PO CAPS
ORAL_CAPSULE | ORAL | Status: AC
  Administered 2021-07-02: 300 mg via ORAL
  Filled 2021-07-02: qty 1

## 2021-07-02 MED ORDER — FENTANYL CITRATE (PF) 250 MCG/5ML IJ SOLN
INTRAMUSCULAR | Status: AC
  Filled 2021-07-02: qty 5

## 2021-07-02 MED ORDER — MIDAZOLAM HCL 2 MG/2ML IJ SOLN
INTRAMUSCULAR | Status: DC | PRN
  Administered 2021-07-02: 2 mg via INTRAVENOUS

## 2021-07-02 MED ORDER — PROMETHAZINE HCL 25 MG/ML IJ SOLN: 6.2500 mg | INTRAMUSCULAR | Status: DC | PRN

## 2021-07-02 MED ORDER — MIDAZOLAM HCL 2 MG/2ML IJ SOLN
INTRAMUSCULAR | Status: AC
  Administered 2021-07-02: 2 mg via INTRAVENOUS
  Filled 2021-07-02: qty 2

## 2021-07-02 MED ORDER — ONDANSETRON HCL 4 MG/2ML IJ SOLN
INTRAMUSCULAR | Status: DC | PRN
  Administered 2021-07-02: 4 mg via INTRAVENOUS

## 2021-07-02 MED ORDER — 0.9 % SODIUM CHLORIDE (POUR BTL) OPTIME
TOPICAL | Status: DC | PRN
  Administered 2021-07-02: 1000 mL

## 2021-07-02 MED ORDER — BUPIVACAINE-EPINEPHRINE (PF) 0.25% -1:200000 IJ SOLN
INTRAMUSCULAR | Status: AC
  Filled 2021-07-02: qty 30

## 2021-07-02 MED ORDER — CEFAZOLIN SODIUM-DEXTROSE 2-4 GM/100ML-% IV SOLN
2.0000 g | INTRAVENOUS | Status: AC
  Administered 2021-07-02: 2 g via INTRAVENOUS

## 2021-07-02 MED ORDER — FENTANYL CITRATE (PF) 100 MCG/2ML IJ SOLN: 25.0000 ug | INTRAMUSCULAR | Status: DC | PRN

## 2021-07-02 MED ORDER — ONDANSETRON HCL 4 MG/2ML IJ SOLN
INTRAMUSCULAR | Status: AC
  Filled 2021-07-02: qty 2

## 2021-07-02 MED ORDER — FENTANYL CITRATE (PF) 250 MCG/5ML IJ SOLN
INTRAMUSCULAR | Status: DC | PRN
  Administered 2021-07-02 (×5): 25 ug via INTRAVENOUS
  Administered 2021-07-02: 50 ug via INTRAVENOUS

## 2021-07-02 MED ORDER — PHENYLEPHRINE 40 MCG/ML (10ML) SYRINGE FOR IV PUSH (FOR BLOOD PRESSURE SUPPORT)
PREFILLED_SYRINGE | INTRAVENOUS | Status: AC
  Filled 2021-07-02: qty 10

## 2021-07-02 MED ORDER — DEXAMETHASONE SODIUM PHOSPHATE 10 MG/ML IJ SOLN
INTRAMUSCULAR | Status: AC
  Filled 2021-07-02: qty 1

## 2021-07-02 SURGICAL SUPPLY — 54 items
BAG COUNTER SPONGE SURGICOUNT (BAG) ×2 IMPLANT
BINDER BREAST MEDIUM (GAUZE/BANDAGES/DRESSINGS) ×1 IMPLANT
BIOPATCH RED 1 DISK 7.0 (GAUZE/BANDAGES/DRESSINGS) ×1 IMPLANT
BNDG COHESIVE 6X5 TAN ST LF (GAUZE/BANDAGES/DRESSINGS) ×1 IMPLANT
BNDG COHESIVE 6X5 TAN STRL LF (GAUZE/BANDAGES/DRESSINGS) ×1 IMPLANT
BNDG GAUZE ELAST 4 BULKY (GAUZE/BANDAGES/DRESSINGS) ×1 IMPLANT
CANISTER SUCT 3000ML PPV (MISCELLANEOUS) ×1 IMPLANT
CHLORAPREP W/TINT 26 (MISCELLANEOUS) ×4 IMPLANT
CLIP TI MEDIUM 6 (CLIP) ×3 IMPLANT
CLIP VESOCCLUDE MED 6/CT (CLIP) ×1 IMPLANT
CNTNR URN SCR LID CUP LEK RST (MISCELLANEOUS) IMPLANT
CONT SPEC 4OZ STRL OR WHT (MISCELLANEOUS)
COVER MAYO STAND STRL (DRAPES) ×2 IMPLANT
COVER SURGICAL LIGHT HANDLE (MISCELLANEOUS) ×2 IMPLANT
DERMABOND ADVANCED (GAUZE/BANDAGES/DRESSINGS) ×1
DERMABOND ADVANCED .7 DNX12 (GAUZE/BANDAGES/DRESSINGS) ×1 IMPLANT
DRAIN CHANNEL 19F RND (DRAIN) ×1 IMPLANT
DRSG IV TEGADERM 3.5X4.5 STRL (GAUZE/BANDAGES/DRESSINGS) ×1 IMPLANT
ELECT REM PT RETURN 9FT ADLT (ELECTROSURGICAL) ×2
ELECTRODE REM PT RTRN 9FT ADLT (ELECTROSURGICAL) ×1 IMPLANT
EVACUATOR SILICONE 100CC (DRAIN) ×1 IMPLANT
GAUZE 4X4 16PLY ~~LOC~~+RFID DBL (SPONGE) ×1 IMPLANT
GAUZE SPONGE 2X2 8PLY STRL LF (GAUZE/BANDAGES/DRESSINGS) IMPLANT
GAUZE SPONGE 4X4 12PLY STRL (GAUZE/BANDAGES/DRESSINGS) ×1 IMPLANT
GLOVE SURG ENC MOIS LTX SZ6 (GLOVE) ×2 IMPLANT
GLOVE SURG UNDER LTX SZ6.5 (GLOVE) ×2 IMPLANT
GOWN STRL REUS W/ TWL LRG LVL3 (GOWN DISPOSABLE) ×1 IMPLANT
GOWN STRL REUS W/TWL 2XL LVL3 (GOWN DISPOSABLE) ×3 IMPLANT
GOWN STRL REUS W/TWL LRG LVL3 (GOWN DISPOSABLE) ×4
KIT BASIN OR (CUSTOM PROCEDURE TRAY) ×2 IMPLANT
KIT TURNOVER KIT B (KITS) ×2 IMPLANT
LIGHT WAVEGUIDE WIDE FLAT (MISCELLANEOUS) ×1 IMPLANT
NDL FILTER BLUNT 18X1 1/2 (NEEDLE) IMPLANT
NDL HYPO 25GX1X1/2 BEV (NEEDLE) IMPLANT
NEEDLE FILTER BLUNT 18X 1/2SAF (NEEDLE)
NEEDLE FILTER BLUNT 18X1 1/2 (NEEDLE) IMPLANT
NEEDLE HYPO 25GX1X1/2 BEV (NEEDLE) ×2 IMPLANT
NS IRRIG 1000ML POUR BTL (IV SOLUTION) ×2 IMPLANT
PACK GENERAL/GYN (CUSTOM PROCEDURE TRAY) ×2 IMPLANT
PACK UNIVERSAL I (CUSTOM PROCEDURE TRAY) ×2 IMPLANT
PAD ARMBOARD 7.5X6 YLW CONV (MISCELLANEOUS) ×4 IMPLANT
PENCIL SMOKE EVACUATOR (MISCELLANEOUS) ×2 IMPLANT
SPONGE GAUZE 2X2 STER 10/PKG (GAUZE/BANDAGES/DRESSINGS) ×1
SPONGE T-LAP 18X18 ~~LOC~~+RFID (SPONGE) ×1 IMPLANT
STOCKINETTE IMPERVIOUS LG (DRAPES) ×1 IMPLANT
STRIP CLOSURE SKIN 1/2X4 (GAUZE/BANDAGES/DRESSINGS) ×2 IMPLANT
SUT ETHILON 2 0 FS 18 (SUTURE) ×2 IMPLANT
SUT MON AB 4-0 PC3 18 (SUTURE) ×2 IMPLANT
SUT SILK 2 0 PERMA HAND 18 BK (SUTURE) ×1 IMPLANT
SUT VIC AB 3-0 SH 27 (SUTURE) ×4
SUT VIC AB 3-0 SH 27X BRD (SUTURE) ×1 IMPLANT
SYR CONTROL 10ML LL (SYRINGE) ×1 IMPLANT
TOWEL GREEN STERILE (TOWEL DISPOSABLE) ×2 IMPLANT
TOWEL GREEN STERILE FF (TOWEL DISPOSABLE) ×2 IMPLANT

## 2021-07-02 NOTE — Anesthesia Postprocedure Evaluation (Signed)
Anesthesia Post Note  Patient: Brenda Waters  Procedure(s) Performed: LEFT AXILLARY LYMPH NODE DISSECTION (Left: Axilla)     Patient location during evaluation: PACU Anesthesia Type: General Level of consciousness: awake Pain management: pain level controlled Vital Signs Assessment: post-procedure vital signs reviewed and stable Cardiovascular status: stable Postop Assessment: no apparent nausea or vomiting Anesthetic complications: no   No notable events documented.  Last Vitals:  Vitals:   07/02/21 1717 07/02/21 1732  BP: 138/88 (!) 158/88  Pulse: 93 100  Resp: 16 17  Temp:  36.8 C  SpO2: 96% 98%    Last Pain:  Vitals:   07/02/21 1732  TempSrc:   PainSc: 0-No pain                 Lyndall Windt

## 2021-07-02 NOTE — Discharge Instructions (Addendum)
[facility] [phone]  MASTECTOMY: POST OP INSTRUCTIONS  Always review your discharge instruction sheet given to you by the facility where your surgery was performed. IF YOU HAVE DISABILITY OR FAMILY LEAVE FORMS, YOU MUST BRING THEM TO THE OFFICE FOR PROCESSING.   DO NOT GIVE THEM TO YOUR DOCTOR. A prescription for pain medication may be given to you upon discharge.  Take your pain medication as prescribed, if needed.  If narcotic pain medicine is not needed, then you may take acetaminophen (Tylenol) or ibuprofen (Advil) as needed. Take your usually prescribed medications unless otherwise directed. If you need a refill on your pain medication, please contact your pharmacy.  They will contact our office to request authorization.  Prescriptions will not be filled after 5pm or on week-ends. You should follow a light diet the first few days after arrival home, such as soup and crackers, etc.  Resume your normal diet the day after surgery. Most patients will experience some swelling and bruising on the chest and underarm.  Ice packs will help.  Swelling and bruising can take several days to resolve.  It is common to experience some constipation if taking pain medication after surgery.  Increasing fluid intake and taking a stool softener (such as Colace) will usually help or prevent this problem from occurring.  A mild laxative (Milk of Magnesia or Miralax) should be taken according to package instructions if there are no bowel movements after 48 hours. Unless discharge instructions indicate otherwise, leave your bandage dry and in place until your next appointment in 3-5 days.  You may take a limited sponge bath.  No tube baths or showers until the drains are removed.  You may have steri-strips (small skin tapes) in place directly over the incision.  These strips should be left on the skin for 7-10 days.  If your surgeon used skin glue on the incision, you may shower in 24 hours.   The glue will flake off over the next 2-3 weeks.  Any sutures or staples will be removed at the office during your follow-up visit. DRAINS:  If you have drains in place, it is important to keep a list of the amount of drainage produced each day in your drains.  Before leaving the hospital, you should be instructed on drain care.  Call our office if you have any questions about your drains. ACTIVITIES:  You may resume regular (light) daily activities beginning the next day--such as daily self-care, walking, climbing stairs--gradually increasing activities as tolerated.  You may have sexual intercourse when it is comfortable.  Refrain from any heavy lifting or straining until approved by your doctor. You may drive when you are no longer taking prescription pain medication, you can comfortably wear a seatbelt, and you can safely maneuver your car and apply brakes. RETURN TO WORK:  __________________________________________________________ You should see your doctor in the office for a follow-up appointment approximately 3-5 days after your surgery.  Your doctor's nurse will typically make your follow-up appointment when she calls you with your pathology report.  Expect your pathology report 2-3 business days after your surgery.  You may call to check if you do not hear from us after three days.   OTHER INSTRUCTIONS: ______________________________________________________________________________________________ ____________________________________________________________________________________________ WHEN TO CALL YOUR DOCTOR: Fever over 101.0 Nausea and/or vomiting Extreme swelling or bruising Continued bleeding from incision. Increased pain, redness, or drainage from the incision. The clinic staff is available to answer your questions during regular business hours.  Please don't hesitate   to call and ask to speak to one of the nurses for clinical concerns.  If you have a medical emergency, go to the  nearest emergency room or call 911.  A surgeon from [facility] is always on call at the hospital. [address], [address], [place], [place]  [address] ? [address] [address], [place], [place]   [address] [phone] ? [phone] ? FAX [phone] Web site: [email or web address]  

## 2021-07-02 NOTE — Anesthesia Procedure Notes (Signed)
Procedure Name: LMA Insertion Date/Time: 07/02/2021 2:42 PM Performed by: Harden Mo, CRNA Pre-anesthesia Checklist: Patient identified, Emergency Drugs available, Suction available and Patient being monitored Patient Re-evaluated:Patient Re-evaluated prior to induction Oxygen Delivery Method: Circle System Utilized Preoxygenation: Pre-oxygenation with 100% oxygen Induction Type: IV induction Ventilation: Mask ventilation without difficulty LMA: LMA inserted LMA Size: 4.0 Number of attempts: 1 Airway Equipment and Method: Bite block Placement Confirmation: positive ETCO2 and breath sounds checked- equal and bilateral Tube secured with: Tape Dental Injury: Teeth and Oropharynx as per pre-operative assessment

## 2021-07-02 NOTE — Op Note (Signed)
PRE-OPERATIVE DIAGNOSIS: left breast cancer, invasive lobular carcinoma, [id number], s/p left mastectomy and sentinel node biopsy with 3/5 positive nodes  POST-OPERATIVE DIAGNOSIS:  Same  PROCEDURE:  Procedure(s): Left axillary node dissection  SURGEON:  Surgeon(s): Stark Klein, MD  ASSISTANT:   Carlena Hurl, PA-C  ANESTHESIA:   regional and general  DRAINS: (19 Fr) Blake drain(s) in the left axilla    LOCAL MEDICATIONS USED:  BUPIVICAINE  and LIDOCAINE   SPECIMEN:  Source of Specimen:  left axillary contents  DISPOSITION OF SPECIMEN:  PATHOLOGY  COUNTS:  YES  DICTATION: .Dragon Dictation  PLAN OF CARE: Discharge to home after PACU  PATIENT DISPOSITION:  PACU - hemodynamically stable.  FINDINGS:  no gross residual disease.  Long thoracic nerve and thoracodorsal nerve intact post dissection  EBL: 25 mL  PROCEDURE:   Patient was identified in the holding area.  She was then taken to the operating room and placed supine on the operating room table.  General anesthesia was induced.  The patient's Steri-Strips were removed from her left mastectomy incision.  The left chest and lateral chest wall was prepped and draped in sterile fashion.  Timeout was performed according to the surgical safety checklist.  When all was correct, we continued.  The lateral aspect of the mastectomy incision was opened with a #10 blade.  There was a small amount of clot from the inferior flap that was evacuated.  The previous drain was removed.  The axillary fat pad was elevated with 2 Allis clamps. An axillary dissection was performed with removal of the associated lymph nodes and surrounding adipose tissue. This included levels I and II. This was accomplished by exposing the axillary vein to the level of the pectoralis minor and laterally over the latissimus dorsi muscle. Posteriorly, the dissection continued to the subscapularis.  Small venous tributaries, lymphatics, and vessels were clipped and ligated  or cauterized and divided. The subscapularis muscle was skeletonized. The long thoracic and thoracodorsal neurovascular bundles were identified and preserved.    The wound was irrigated.  Hemostasis was achieved with the cautery and with blue clips as needed.  A new 19 [place] Blake drain was placed in the axilla.  The wound was irrigated and closed with 3-0 Vicryl deep dermal interrupted sutures and 4-0 vicryl subcuticular closure in layers.  Wound was then cleaned, dried, and dressed with dry sterile dressings.  The patient was then allowed to emerge from anesthesia and was taken to the PACU in stable condition.  Needle, sponge, and instrument counts were correct x2.

## 2021-07-02 NOTE — Anesthesia Procedure Notes (Signed)
Anesthesia Regional Block: Pectoralis block   Pre-Anesthetic Checklist: , timeout performed,  Correct Patient, Correct Site, Correct Laterality,  Correct Procedure, Correct Position, site marked,  Risks and benefits discussed,  Surgical consent,  Pre-op evaluation,  At surgeon's request and post-op pain management  Laterality: Left  Prep: chloraprep       Needles:  Injection technique: Single-shot  Needle Type: Echogenic Needle     Needle Length: 9cm  Needle Gauge: 21     Additional Needles:   Procedures:,,,, ultrasound used (permanent image in chart),,    Narrative:  Start time: 07/02/2021 12:15 PM End time: 07/02/2021 12:20 PM Injection made incrementally with aspirations every 5 mL.  Performed by: Personally  Anesthesiologist: Santa Lighter, MD  Additional Notes: No pain on injection. No increased resistance to injection. Injection made in 5cc increments.  Good needle visualization.  Patient tolerated procedure well.

## 2021-07-02 NOTE — Interval H&P Note (Signed)
History and Physical Interval Note:  07/02/2021 2:21 PM  Brenda Waters  has presented today for surgery, with the diagnosis of LEFT BREAST CANCER.  She is s/p left mastectomy and sentinel node biopsy.  Three of five nodes were positive.  Given her age, prognostic profile, and number of nodes with cancer in them, it is standard of care to perform completion axillary node dissection.  The various methods of treatment have been discussed with the patient. After consideration of risks, benefits and other options for treatment, the patient has consented to  Procedure(s): LEFT AXILLARY LYMPH NODE DISSECTION (Left) as a surgical intervention.  The patient's history has been reviewed, patient examined, no change in status, stable for surgery.  I have reviewed the patient's chart and labs.  Questions were answered to the patient's satisfaction.     Stark Klein

## 2021-07-02 NOTE — Transfer of Care (Signed)
Immediate Anesthesia Transfer of Care Note  Patient: [name]  Procedure(s) Performed: LEFT AXILLARY LYMPH NODE DISSECTION (Left: Axilla)  Patient Location: PACU  Anesthesia Type:General  Level of Consciousness: awake and alert   Airway & Oxygen Therapy: Patient Spontanous Breathing and Patient connected to nasal cannula oxygen  Post-op Assessment: Report given to RN and Post -op Vital signs reviewed and stable  Post vital signs: Reviewed and stable  Last Vitals:  Vitals Value Taken Time  BP 109/93 07/02/21 1702  Temp 36.8 C 07/02/21 1702  Pulse 98 07/02/21 1707  Resp 21 07/02/21 1707  SpO2 99 % 07/02/21 1707  Vitals shown include unvalidated device data.  Last Pain:  Vitals:   07/02/21 1702  TempSrc:   PainSc: 0-No pain         Complications: No notable events documented.

## 2021-07-03 ENCOUNTER — Encounter (HOSPITAL_COMMUNITY): Payer: Self-pay | Admitting: General Surgery

## 2021-07-04 NOTE — Addendum Note (Signed)
Addendum  created 07/04/21 0951 by Janeece Riggers, MD   Clinical Note Signed, Intraprocedure Blocks edited, SmartForm saved

## 2021-07-07 ENCOUNTER — Encounter: Payer: Self-pay | Admitting: *Deleted

## 2021-07-08 LAB — SURGICAL PATHOLOGY

## 2021-07-09 ENCOUNTER — Telehealth: Payer: Self-pay | Admitting: *Deleted

## 2021-07-09 ENCOUNTER — Encounter: Payer: Self-pay | Admitting: *Deleted

## 2021-07-09 NOTE — Telephone Encounter (Signed)
Received order for oncotype testing. Requisition faxed to pathology and [facility] °

## 2021-07-16 DIAGNOSIS — Z170 Estrogen receptor positive status [ER+]: Secondary | ICD-10-CM | POA: Diagnosis not present

## 2021-07-16 DIAGNOSIS — C50412 Malignant neoplasm of upper-outer quadrant of left female breast: Secondary | ICD-10-CM | POA: Diagnosis not present

## 2021-07-17 ENCOUNTER — Encounter: Payer: Self-pay | Admitting: *Deleted

## 2021-07-18 ENCOUNTER — Encounter: Payer: Self-pay | Admitting: *Deleted

## 2021-07-18 ENCOUNTER — Telehealth: Payer: Self-pay | Admitting: *Deleted

## 2021-07-18 DIAGNOSIS — C50412 Malignant neoplasm of upper-outer quadrant of left female breast: Secondary | ICD-10-CM

## 2021-07-18 NOTE — Telephone Encounter (Signed)
Received oncotype results of 14/14%. Left message for a return phone call on patient's VM. Referral placed for Dr. Sondra Come. ?

## 2021-07-21 NOTE — Progress Notes (Incomplete)
Location of Breast Cancer: upper-outer quadrant of left breast ? 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 ?Receptor Status:  ?Estrogen Receptor: Positive (80%, strong)  ?           Progesterone Receptor: Positive (70%, strong)  ?           HER2: Equivocal (2+) by IHC; HER2 negative by FISH  ?           Ki-67: 15%  ? ?Did patient present with symptoms (if so, please note symptoms) or was this found on screening mammography?: -presented with palpable left breast lump ? ?Past/Anticipated interventions by surgeon, if any:  ?Left Mastectomy with Sentinel Node Biopsy Procedure Note ?Surgeon: Stark Klein  ? ?PROCEDURE:  Procedure(s): ?Left axillary node dissection ?SURGEON:  Surgeon(s): ?Stark Klein, MD ? ?Past/Anticipated interventions by medical oncology, if any: Dr Burr Medico ?PLAN:  ?-breast MRI to evaluate the extent of her disease due to lobular histology ?-she will proceed with breast surgery soon, likely mastectomy and sentinel lymph node biopsy ?-Oncotype on her surgical sample, or MammaPrint if positive lymph node ?-I will see her after surgery or radiation. ? ?Lymphedema issues, if any:  {:18581} {t:21944}  ? ?Pain issues, if any:  {:18581} {PAIN DESCRIPTION:[id number]} ? ?SAFETY ISSUES: ?Prior radiation? {:18581} ?Pacemaker/ICD? {:18581} ?Possible current pregnancy?no, postmenopausal ?Is the patient on methotrexate? {:18581} ? ?Current Complaints / other details:  *** ?   ?*** ? ? ?

## 2021-07-22 ENCOUNTER — Encounter: Payer: Self-pay | Admitting: *Deleted

## 2021-07-27 NOTE — Progress Notes (Signed)
?Radiation Oncology         [phone] ?________________________________ ? ?Name: Brenda Waters MRN: [id number]  ?Date: 07/28/2021  DOB: [birth year] ? ?Re-Evaluation Note ? ?CC: Almedia Balls, NP  Truitt Merle, MD ? ?  ICD-10-CM   ?1. Malignant neoplasm of upper-outer quadrant of left breast in female, estrogen receptor positive ([place])  C50.412   ? Z17.0   ?  ? ? ?Diagnosis:  S/p left mastectomy: Stage IB (cT2, cN0, cM0) Left Breast UOQ, Invasive pleomorphic lobular carcinoma with LCIS, ER+ / PR+ / Her2-, Grade 2 ? ?Narrative:  The patient returns today to discuss radiation treatment options. She was seen in the multidisciplinary breast clinic on 04/23/21.  ? ?Pertinent imaging performed since the patient was last seen for consultation includes a bilateral breast MRI on 04/28/21 which showed the biopsy-proven malignancy in the upper left breast, spanning approximately 5.3 x 3.3 x 4.9 cm. No MRI evidence of malignancy was appreciated elsewhere within either breast, or suspicious lymphadenopathy. 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  ? ?She opted to proceed with left mastectomy and nodal biopsies on 06/12/21 under the care of Dr. Barry Dienes. 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 All margins negative for carcinoma. Nodal status of 3/5 left axillary sentinel lymph nodes positive for metastatic lobular carcinoma; 2/3 positive nodes also with extranodal extension. Prognostic indicators significant for: estrogen receptor 80% positive; progesterone receptor 70% positive, both with strong staining intensity; Proliferation marker Ki67 at 15%; Her2 status negative; Grade 2.  ? ?Subsequently, the patient underwent left axillary lymph node dissection on  07/02/21. Final pathology revealed no evidence of carcinoma in 14/14 lymph nodes. ? ?Per her most recent follow-up with Dr. Barry Dienes on 07/18/21, the patient reported doing well from a post-op standpoint. She also had her drain removed during this visit.  ? ?The patient has met with Dr. Burr Medico and will return to her soon to further discuss antiestrogen/AI treatment options. ? ?On review of systems, the patient reports some soreness and tightness along the chest wall region but no pain. She denies swelling in her left arm or hand.  She does report some numbness along the upper arm region. ? ? ?Allergies:  has No Known Allergies. ? ?Meds: ?Current Outpatient Medications  ?Medication Sig Dispense Refill  ? acetaminophen (TYLENOL) 325 MG tablet Take 650 mg by mouth every 6 (six) hours as needed for moderate pain.    ? ciclopirox (PENLAC) 8 % solution Apply 1 drop topically at bedtime.    ? fluticasone (FLONASE) 50 MCG/ACT nasal spray Place 2 sprays into both nostrils at bedtime.    ? gabapentin (NEURONTIN) 100 MG capsule Take 2 capsules (200 mg total) by mouth 2 (two) times daily. 40 capsule 1  ? ibuprofen (ADVIL) 600 MG tablet Take 1 tablet (600 mg total) by mouth every 8 (eight) hours as needed for mild pain or moderate pain. 30 tablet 1  ? losartan-hydrochlorothiazide (HYZAAR) 50-12.5 MG tablet Take 1 tablet by mouth daily.    ? methocarbamol (ROBAXIN) 500 MG tablet Take 1 tablet (500 mg total) by mouth every 6 (six) hours as needed for muscle spasms. 20 tablet 1  ? Multiple Vitamin (MULTIVITAMIN WITH MINERALS) TABS tablet Take 1 tablet by mouth daily.    ? oxyCODONE (  OXY IR/ROXICODONE) 5 MG immediate release tablet Take 1 tablet (5 mg total) by mouth every 6 (six) hours as needed for severe pain or breakthrough pain. 5 tablet 0  ? Apple Cider Vinegar 500 MG TABS Take 500 mg by mouth in the morning and at bedtime. (Patient not taking: Reported on 07/28/2021)    ? ?No current facility-administered medications for this  encounter.  ? ? ?Physical Findings: ?The patient is in no acute distress. Patient is alert and oriented. ? height is 5' 1"  (1.549 m) and weight is 132 lb 12.8 oz (60.2 kg). Her temperature is 97.7 ?F (36.5 ?C). Her blood pressure is 128/81 and her pulse is 78. Her respiration is 18 and oxygen saturation is 100%.   Lungs are clear to auscultation bilaterally. Heart has regular rate and rhythm. No palpable cervical, supraclavicular, or axillary adenopathy. Abdomen soft, non-tender, normal bowel sounds. ?Right breast: no palpable mass, nipple discharge or bleeding. ?The left chest wall area shows a mastectomy scar which is healing well without signs of drainage or infection.  Some eschar noted in the central portion.  Axillary scar healing well without signs of drainage or infection.  Mild swelling in the soft tissues of the left chest wall region. ? ?Lab Findings: ?Lab Results  ?Component Value Date  ? WBC 13.9 (H) 06/13/2021  ? HGB 10.2 (L) 06/13/2021  ? HCT 29.1 (L) 06/13/2021  ? MCV 89.5 06/13/2021  ? PLT 276 06/13/2021  ? ? ?Radiographic Findings: ?No results found. ? ?Impression:  S/p left mastectomy: Stage IB (cT2, cN0, cM0) Left Breast UOQ, Invasive pleomorphic lobular carcinoma with LCIS, ER+ / PR+ / Her2-, Grade 2 ? ?The patient would be a good candidate for postmastectomy radiation therapy given her node positivity.  I discussed the course of postmastectomy radiation therapy, anticipated side effects and potential long-term toxicities.  She appears to understand and wishes to proceed with planned course of treatment. ? ?Plan:  Patient is scheduled for CT simulation later today.  Treatments to begin in approximately a week.  Anticipate 6 weeks of radiation therapy.  We will use cardiac sparing techniques if necessary. ? ?----------------------------------- ? ?Blair Promise, PhD, MD ? ?This document serves as a record of services personally performed by Gery Pray, MD. It was created on his behalf by Roney Mans, a trained medical scribe. The creation of this record is based on the scribe's personal observations and the provider's statements to them. This document has been checked and approved by the attending provider. ? ?

## 2021-07-28 ENCOUNTER — Ambulatory Visit
Admission: RE | Admit: 2021-07-28 | Discharge: 2021-07-28 | Disposition: A | Discharge status: Home / Self Care | Payer: 59 | Source: Ambulatory Visit | Attending: Radiation Oncology | Admitting: Radiation Oncology

## 2021-07-28 ENCOUNTER — Ambulatory Visit: Payer: 59

## 2021-07-28 ENCOUNTER — Other Ambulatory Visit: Payer: Self-pay

## 2021-07-28 ENCOUNTER — Encounter: Payer: Self-pay | Admitting: Radiation Oncology

## 2021-07-28 VITALS — BP 128/81 | HR 78 | Temp 97.7°F | Resp 18 | Ht 61.0 in | Wt 132.8 lb

## 2021-07-28 DIAGNOSIS — R200 Anesthesia of skin: Secondary | ICD-10-CM | POA: Insufficient documentation

## 2021-07-28 DIAGNOSIS — Z79899 Other long term (current) drug therapy: Secondary | ICD-10-CM | POA: Insufficient documentation

## 2021-07-28 DIAGNOSIS — Z9012 Acquired absence of left breast and nipple: Secondary | ICD-10-CM | POA: Insufficient documentation

## 2021-07-28 DIAGNOSIS — Z510 Encounter for antineoplastic radiation therapy: Secondary | ICD-10-CM | POA: Insufficient documentation

## 2021-07-28 DIAGNOSIS — Z170 Estrogen receptor positive status [ER+]: Secondary | ICD-10-CM

## 2021-07-28 DIAGNOSIS — C50412 Malignant neoplasm of upper-outer quadrant of left female breast: Secondary | ICD-10-CM

## 2021-07-28 DIAGNOSIS — Z923 Personal history of irradiation: Secondary | ICD-10-CM | POA: Insufficient documentation

## 2021-07-28 NOTE — Progress Notes (Signed)
See MD note for nursing evaluation. °

## 2021-07-28 NOTE — Progress Notes (Deleted)
Location of Breast Cancer: upper-outer quadrant of left breast ? 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 ?Receptor Status:  ?Estrogen Receptor: Positive (80%, strong)  ?           Progesterone Receptor: Positive (70%, strong)  ?           HER2: Equivocal (2+) by IHC; HER2 negative by FISH  ?           Ki-67: 15%  ? ?Did patient present with symptoms (if so, please note symptoms) or was this found on screening mammography?: -presented with palpable left breast lump ? ?Past/Anticipated interventions by surgeon, if any:  ?Left Mastectomy with Sentinel Node Biopsy Procedure Note ?Surgeon: Stark Klein  ? ?PROCEDURE:  Procedure(s): ?Left axillary node dissection ?SURGEON:  Surgeon(s): ?Stark Klein, MD ? ?Past/Anticipated interventions by medical oncology, if any: Dr Burr Medico ?PLAN:  ?-breast MRI to evaluate the extent of her disease due to lobular histology ?-she will proceed with breast surgery soon, likely mastectomy and sentinel lymph node biopsy ?-Oncotype on her surgical sample, or MammaPrint if positive lymph node ?-I will see her after surgery or radiation. ? ?Lymphedema issues, if any:  no   ? ?Pain issues, if any:   intermittent and tenderness of left breast ? ?SAFETY ISSUES: ?Prior radiation? no ?Pacemaker/ICD? no ?Possible current pregnancy?no, postmenopausal ?Is the patient on methotrexate? no ? ?Current Complaints / other details:  wearing compression bra due to swelling of left breast. ?   ? ? ? ? ?

## 2021-07-28 NOTE — Progress Notes (Signed)
Location of Breast Cancer: upper-outer quadrant of left breast ? 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 ?Receptor Status:  ?Estrogen Receptor: Positive (80%, strong)  ?           Progesterone Receptor: Positive (70%, strong)  ?           HER2: Equivocal (2+) by IHC; HER2 negative by FISH  ?           Ki-67: 15%  ? ?Did patient present with symptoms (if so, please note symptoms) or was this found on screening mammography?: -presented with palpable left breast lump ? ?Past/Anticipated interventions by surgeon, if any:  ?Left Mastectomy with Sentinel Node Biopsy Procedure Note ?Surgeon: Stark Klein  ? ?PROCEDURE:  Procedure(s): ?Left axillary node dissection ?SURGEON:  Surgeon(s): ?Stark Klein, MD ? ?Past/Anticipated interventions by medical oncology, if any: Dr Burr Medico ?PLAN:  ?-breast MRI to evaluate the extent of her disease due to lobular histology ?-she will proceed with breast surgery soon, likely mastectomy and sentinel lymph node biopsy ?-Oncotype on her surgical sample, or MammaPrint if positive lymph node ?-I will see her after surgery or radiation. ? ?Lymphedema issues, if any:  no   ? ?Pain issues, if any:   intermittent and tenderness of left breast ? ?SAFETY ISSUES: ?Prior radiation? no ?Pacemaker/ICD? no ?Possible current pregnancy?no, postmenopausal ?Is the patient on methotrexate? no ? ?Current Complaints / other details:  wearing compression bra due to swelling of left breast. ?   ?Vitals:  ? 07/28/21 1315  ?BP: 128/81  ?Pulse: 78  ?Resp: 18  ?Temp: 97.7 ?F (36.5 ?C)  ?SpO2: 100%  ?Weight: 132 lb 12.8 oz (60.2 kg)  ?Height: 5' 1"  (1.549 m)  ? ? ?

## 2021-08-03 DIAGNOSIS — Z170 Estrogen receptor positive status [ER+]: Secondary | ICD-10-CM | POA: Diagnosis not present

## 2021-08-03 DIAGNOSIS — C50412 Malignant neoplasm of upper-outer quadrant of left female breast: Secondary | ICD-10-CM | POA: Diagnosis not present

## 2021-08-03 NOTE — Therapy (Signed)
?OUTPATIENT PHYSICAL THERAPY ONCOLOGY EVALUATION ? ?Patient Name: Brenda Waters ?MRN: [id number] ?DOB:Nov 30, 1965, 56 y.o., female ?Today's Date: 08/04/2021 ? ? PT End of Session - 08/04/21 0848   ? ? Visit Number 1   ? Number of Visits 6   ? Date for PT Re-Evaluation 09/15/21   ? Authorization - Number of Visits 35   ? PT Start Time 0800   ? PT Stop Time 0845   ? PT Time Calculation (min) 45 min   ? Activity Tolerance Patient tolerated treatment well   ? Behavior During Therapy [facility] for tasks assessed/performed   ? ?  ?  ? ?  ? ? ?Past Medical History:  ?Diagnosis Date  ? Breast cancer ([place])   ? Left  ? GERD (gastroesophageal reflux disease)   ? diet controlled  ? Heart murmur   ? no current problems  ? Hypertension   ? Mitral valve prolapse   ? ?Past Surgical History:  ?Procedure Laterality Date  ? CESAREAN SECTION    ? x 2 in 1991 and 1993  ? MASTECTOMY W/ SENTINEL NODE BIOPSY Left 06/12/2021  ? Procedure: LEFT MASTECTOMY WITH SENTINEL LYMPH NODE BIOPSY;  Surgeon: Stark Klein, MD;  Location: [place];  Service: General;  Laterality: Left;  ? NODE DISSECTION Left 07/02/2021  ? Procedure: LEFT AXILLARY LYMPH NODE DISSECTION;  Surgeon: Stark Klein, MD;  Location: [place];  Service: General;  Laterality: Left;  ? [place]  ? WISDOM TOOTH EXTRACTION    ? ?Patient Active Problem List  ? Diagnosis Date Noted  ? Breast cancer of upper-outer quadrant of left female breast ([place]) 06/12/2021  ? Genetic testing 04/23/2021  ? Malignant neoplasm of upper-outer quadrant of left breast in female, estrogen receptor positive ([place]) 04/21/2021  ? ? ?PCP: Almedia Balls, NP ? ?REFERRING PROVIDER: Stark Klein, MD ? ?REFERRING DIAG: C50.412,Z17.0 (ICD-10-CM) - Malignant neoplasm of upper-outer quadrant of left breast in female, estrogen receptor positive ([place]) ? ?THERAPY DIAG:  ?Malignant neoplasm of upper-outer quadrant of left breast in female, estrogen receptor positive ([place]) ? ?Aftercare following surgery for  neoplasm ? ?ONSET DATE: 06/12/21 ? ?SUBJECTIVE                                                                                                                                                                                          ? ?SUBJECTIVE STATEMENT: I have insurance now.  I am ready to go for radiation.  I am doing okay overall  ? ? ?PERTINENT HISTORY:  ?S/p left mastectomy on 06/12/21 with Dr. Barry Dienes 3/5 Lymph nodes positive. ALND on 07/02/21 with 14/14 lymph nodes  negative. Drain removed 07/18/21. Will be starting radiation: Stage IB (cT2, cN0, cM0) Left Breast UOQ, Invasive pleomorphic lobular carcinoma with LCIS, ER+ / PR+ / Her2-, Grade 2 ? ?PAIN:  ?Are you having pain? No ?Just tight ? ?PRECAUTIONS: Left UE lymphedema risk ? ?WEIGHT BEARING RESTRICTIONS no ? ?FALLS:  ?Has patient fallen in last 6 months? No,  ? ?LIVING ENVIRONMENT: ?Lives with: lives with their family and lives with their spouse ? 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 ?LEISURE: walking, stretching, I usually do push ups  ? ?HAND DOMINANCE : right  ? ?PRIOR LEVEL OF FUNCTION: Independent ? ?PATIENT GOALS Can I get back to work  ? ? ?OBJECTIVE ? ?COGNITION: ? Overall cognitive status: Within functional limits for tasks assessed  ? ?PALPATION: Tightness Lt pectoralis belly to incision, tightness and puckering incision laterally, slight edema Lt lateral chest ? ?OBSERVATIONS / OTHER ASSESSMENTS: eschar mid incision and scabbing lateral incision but no opening or sig pulling with flexion overhead ? ?POSTURE: WNL ? ?UPPER EXTREMITY AROM/PROM: ? ?A/PROM RIGHT  08/04/2021 ?  ?Shoulder extension 70  ?Shoulder flexion 157  ?Shoulder abduction 165  ?Shoulder internal rotation   ?Shoulder external rotation 90  ?  (Blank rows = not tested) ? ?A/PROM LEFT  08/04/2021  ?Shoulder extension 62  ?Shoulder flexion 145  ?Shoulder abduction 145 - chest tightness  ?Shoulder internal rotation   ?Shoulder external rotation 90   ?  (Blank rows = not tested) ? ? ?LYMPHEDEMA ASSESSMENTS:  ? ?[place] RIGHT  08/04/2021  ?10 cm proximal to olecranon process   ?Olecranon process   ?10 cm proximal to ulnar styloid process   ?Just proximal to ulnar styloid process   ?Across hand at thumb web space   ?At base of 2nd digit   ?(Blank rows = not tested) ? ?[place] LEFT  08/04/2021  ?10 cm proximal to olecranon process   ?Olecranon process   ?10 cm proximal to ulnar styloid process   ?Just proximal to ulnar styloid process   ?Across hand at thumb web space   ?At base of 2nd digit   ?(Blank rows = not tested) ? ?QUICK DASH SURVEY: 9% ? ?TODAY'S TREATMENT  ?08/04/21 ?Eval completed ?Education on post op stretches to start with performance of each and handout each performed x 2 with vcs and tcs for completion ?Brief education on lymphedema but will complete this next visit ?Gave pt script and info on bras ? ?PATIENT EDUCATION:  ?Education details: HEP ?Person educated: Patient ?Education method: Explanation, Demonstration, Tactile cues, Verbal cues, and Handouts ?Education comprehension: verbalized understanding, returned demonstration, and verbal cues required ? ? ?HOME EXERCISE PROGRAM: ?Post op sheet ? ?ASSESSMENT: ? ?CLINICAL IMPRESSION: ?Patient is a 56 y.o. female who was seen today for physical therapy evaluation and treatment for post op care after Lt mastectomy and ALND.  Pt has decreased AROM into flexion and abduction, mild chest edema, incisional tightness chest and JP site, and the need for lymphedema education and sleeve due to full ALND.  Pt will be starting radiation tomorrow and is agreeable to 1x per week x up to 6 weeks to maximize ROM.  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   ? ? ?OBJECTIVE IMPAIRMENTS decreased activity tolerance, decreased knowledge of use of DME, decreased ROM, and decreased strength.  ? ?ACTIVITY LIMITATIONS cleaning, occupation, and  yard  work.  ? ?PERSONAL FACTORS 1-2 comorbidities: ALND, mastectomy   are also affecting patient's functional outcome.  ? ? ?REHAB POTENTIAL: Excellent ? ?CLINICAL DECISION MAKING: Stable/uncomplicated ? ?EVALUATION COMPLEXITY: Low ? ?GOALS: ?Goals reviewed with patient? Yes ? ?SHORT TERM GOALS: ? ?Pt will be education on lymphedema risk reduction, surveillance, and how to use compression for work` ?Baseline:  ?Target date: 08/18/2021 ?Goal status: INITIAL ? ? ?LONG TERM GOALS: ? ?Pt will improve Lt shoulder AROM to within 5 deg of the Rt arm to demonstrate improved mobility and reach ?Baseline:  ?Target date: 09/15/2021 ?Goal status: INITIAL ? ?2.  Pt will be ind with final stretches and strengthening to get ready for return to work ?Baseline:  ?Target date: 09/15/2021 ?Goal status: INITIAL ? ? ?PLAN: ?PT FREQUENCY: 1x/week ? ?PT DURATION: 6 weeks ? ?PLANNED INTERVENTIONS: Therapeutic exercises, Therapeutic activity, Neuromuscular re-education, Patient/Family education, and Manual therapy ? ?PLAN FOR NEXT SESSION: focus on lymphedema education, circumferences, LDEX?, benefits back for sleeve vs self order?    Lt AAROM: pulleys, ball, PROM and STM to improve AROM - eventually supine scap as able ? ? ? ? ?Stark Bray, PT ?08/04/2021, 8:50 AM ? ?

## 2021-08-04 ENCOUNTER — Encounter: Payer: Self-pay | Admitting: *Deleted

## 2021-08-04 ENCOUNTER — Ambulatory Visit: Payer: 59 | Attending: General Surgery | Admitting: Rehabilitation

## 2021-08-04 ENCOUNTER — Telehealth: Payer: Self-pay | Admitting: Hematology

## 2021-08-04 ENCOUNTER — Other Ambulatory Visit: Payer: Self-pay

## 2021-08-04 ENCOUNTER — Encounter: Payer: Self-pay | Admitting: Rehabilitation

## 2021-08-04 DIAGNOSIS — Z170 Estrogen receptor positive status [ER+]: Secondary | ICD-10-CM | POA: Diagnosis not present

## 2021-08-04 DIAGNOSIS — C50412 Malignant neoplasm of upper-outer quadrant of left female breast: Secondary | ICD-10-CM | POA: Diagnosis not present

## 2021-08-04 DIAGNOSIS — Z483 Aftercare following surgery for neoplasm: Secondary | ICD-10-CM | POA: Insufficient documentation

## 2021-08-04 DIAGNOSIS — Z510 Encounter for antineoplastic radiation therapy: Secondary | ICD-10-CM | POA: Diagnosis not present

## 2021-08-04 NOTE — Telephone Encounter (Signed)
Scheduled appointment per inbasket message. Left message.   ?

## 2021-08-05 ENCOUNTER — Ambulatory Visit
Admission: RE | Admit: 2021-08-05 | Discharge: 2021-08-05 | Disposition: A | Discharge status: Home / Self Care | Payer: 59 | Source: Ambulatory Visit | Attending: Radiation Oncology | Admitting: Radiation Oncology

## 2021-08-05 DIAGNOSIS — C50412 Malignant neoplasm of upper-outer quadrant of left female breast: Secondary | ICD-10-CM | POA: Diagnosis not present

## 2021-08-05 DIAGNOSIS — Z170 Estrogen receptor positive status [ER+]: Secondary | ICD-10-CM

## 2021-08-05 DIAGNOSIS — Z510 Encounter for antineoplastic radiation therapy: Secondary | ICD-10-CM | POA: Diagnosis not present

## 2021-08-05 MED ORDER — RADIAPLEXRX EX GEL: Freq: Once | CUTANEOUS | Status: AC

## 2021-08-05 NOTE — Progress Notes (Signed)
Pt here for patient teaching.   ? ?Pt given Radiation and You booklet, skin care instructions, and Radiaplex gel.   ? ?Reviewed areas of pertinence such as fatigue, hair loss, skin changes, breast tenderness, and breast swelling .  ? ?Pt able to give teach back of use unscented/gentle soap,apply Radiaplex bid, avoid applying anything to skin within 4 hours of treatment, avoid wearing an under wire bra, and to use an electric razor if they must shave.  ? ?Pt verbalizes understanding of information given and will contact nursing with any questions or concerns.   ? ? ? ? ? ? ?  ?

## 2021-08-06 ENCOUNTER — Other Ambulatory Visit: Payer: Self-pay

## 2021-08-06 ENCOUNTER — Ambulatory Visit
Admission: RE | Admit: 2021-08-06 | Discharge: 2021-08-06 | Disposition: A | Discharge status: Home / Self Care | Payer: 59 | Source: Ambulatory Visit | Attending: Radiation Oncology | Admitting: Radiation Oncology

## 2021-08-06 DIAGNOSIS — Z170 Estrogen receptor positive status [ER+]: Secondary | ICD-10-CM | POA: Diagnosis not present

## 2021-08-06 DIAGNOSIS — Z510 Encounter for antineoplastic radiation therapy: Secondary | ICD-10-CM | POA: Diagnosis not present

## 2021-08-06 DIAGNOSIS — C50412 Malignant neoplasm of upper-outer quadrant of left female breast: Secondary | ICD-10-CM | POA: Diagnosis not present

## 2021-08-07 ENCOUNTER — Other Ambulatory Visit: Payer: Self-pay

## 2021-08-07 ENCOUNTER — Ambulatory Visit
Admission: RE | Admit: 2021-08-07 | Discharge: 2021-08-07 | Disposition: A | Discharge status: Home / Self Care | Payer: 59 | Source: Ambulatory Visit | Attending: Radiation Oncology | Admitting: Radiation Oncology

## 2021-08-07 DIAGNOSIS — Z170 Estrogen receptor positive status [ER+]: Secondary | ICD-10-CM | POA: Diagnosis not present

## 2021-08-07 DIAGNOSIS — Z510 Encounter for antineoplastic radiation therapy: Secondary | ICD-10-CM | POA: Diagnosis not present

## 2021-08-07 DIAGNOSIS — C50412 Malignant neoplasm of upper-outer quadrant of left female breast: Secondary | ICD-10-CM | POA: Diagnosis not present

## 2021-08-08 ENCOUNTER — Ambulatory Visit
Admission: RE | Admit: 2021-08-08 | Discharge: 2021-08-08 | Disposition: A | Discharge status: Home / Self Care | Payer: 59 | Source: Ambulatory Visit | Attending: Radiation Oncology | Admitting: Radiation Oncology

## 2021-08-08 DIAGNOSIS — C50412 Malignant neoplasm of upper-outer quadrant of left female breast: Secondary | ICD-10-CM | POA: Diagnosis not present

## 2021-08-08 DIAGNOSIS — Z170 Estrogen receptor positive status [ER+]: Secondary | ICD-10-CM | POA: Diagnosis not present

## 2021-08-08 DIAGNOSIS — Z510 Encounter for antineoplastic radiation therapy: Secondary | ICD-10-CM | POA: Diagnosis not present

## 2021-08-11 ENCOUNTER — Ambulatory Visit: Payer: 59 | Attending: General Surgery | Admitting: Rehabilitation

## 2021-08-11 ENCOUNTER — Ambulatory Visit
Admission: RE | Admit: 2021-08-11 | Discharge: 2021-08-11 | Disposition: A | Discharge status: Home / Self Care | Payer: 59 | Source: Ambulatory Visit | Attending: Radiation Oncology | Admitting: Radiation Oncology

## 2021-08-11 ENCOUNTER — Encounter: Payer: Self-pay | Admitting: Rehabilitation

## 2021-08-11 ENCOUNTER — Other Ambulatory Visit: Payer: Self-pay

## 2021-08-11 DIAGNOSIS — C50412 Malignant neoplasm of upper-outer quadrant of left female breast: Secondary | ICD-10-CM | POA: Diagnosis not present

## 2021-08-11 DIAGNOSIS — Z170 Estrogen receptor positive status [ER+]: Secondary | ICD-10-CM

## 2021-08-11 DIAGNOSIS — Z510 Encounter for antineoplastic radiation therapy: Secondary | ICD-10-CM | POA: Diagnosis not present

## 2021-08-11 DIAGNOSIS — Z483 Aftercare following surgery for neoplasm: Secondary | ICD-10-CM | POA: Diagnosis not present

## 2021-08-11 NOTE — Therapy (Addendum)
?OUTPATIENT PHYSICAL THERAPY ONCOLOGY TREATMENT ? ?Patient Name: Brenda Waters ?MRN: [id number] ?DOB:1965-06-24, 57 y.o., female ?Today's Date: 08/11/2021 ? ? PT End of Session - 08/11/21 1055   ? ? Visit Number 2   ? Number of Visits 6   ? Date for PT Re-Evaluation 09/15/21   ? PT Start Time 1100   ? PT Stop Time 1155   ? PT Time Calculation (min) 55 min   ? Activity Tolerance Patient tolerated treatment well   ? Behavior During Therapy [facility] for tasks assessed/performed   ? ?  ?  ? ?  ? ? ?Past Medical History:  ?Diagnosis Date  ? Breast cancer ([place])   ? Left  ? GERD (gastroesophageal reflux disease)   ? diet controlled  ? Heart murmur   ? no current problems  ? Hypertension   ? Mitral valve prolapse   ? ?Past Surgical History:  ?Procedure Laterality Date  ? CESAREAN SECTION    ? x 2 in 1991 and 1993  ? MASTECTOMY W/ SENTINEL NODE BIOPSY Left 06/12/2021  ? Procedure: LEFT MASTECTOMY WITH SENTINEL LYMPH NODE BIOPSY;  Surgeon: Stark Klein, MD;  Location: [place];  Service: General;  Laterality: Left;  ? NODE DISSECTION Left 07/02/2021  ? Procedure: LEFT AXILLARY LYMPH NODE DISSECTION;  Surgeon: Stark Klein, MD;  Location: [place];  Service: General;  Laterality: Left;  ? [place]  ? WISDOM TOOTH EXTRACTION    ? ?Patient Active Problem List  ? Diagnosis Date Noted  ? Breast cancer of upper-outer quadrant of left female breast ([place]) 06/12/2021  ? Genetic testing 04/23/2021  ? Malignant neoplasm of upper-outer quadrant of left breast in female, estrogen receptor positive ([place]) 04/21/2021  ? ? ?PCP: Almedia Balls, NP ? ?REFERRING PROVIDER: Stark Klein, MD ? ?REFERRING DIAG: C50.412,Z17.0 (ICD-10-CM) - Malignant neoplasm of upper-outer quadrant of left breast in female, estrogen receptor positive ([place]) ? ?THERAPY DIAG:  ?Malignant neoplasm of upper-outer quadrant of left breast in female, estrogen receptor positive ([place]) ? ?Aftercare following surgery for neoplasm ? ?ONSET DATE: 06/12/21 ? ?SUBJECTIVE                                                                                                                                                                                           ? ?SUBJECTIVE STATEMENT:   ?I think my arm is swelling.  The upper part of my arm is sore and hurting. Not sure why. I got my compression bra  ? ?PERTINENT HISTORY:  ?S/p left mastectomy on 06/12/21 with Dr. Barry Dienes 3/5 Lymph nodes positive. ALND on 07/02/21 with 14/14 lymph nodes negative.  Drain removed 07/18/21. Will be starting radiation: Stage IB (cT2, cN0, cM0) Left Breast UOQ, Invasive pleomorphic lobular carcinoma with LCIS, ER+ / PR+ / Her2-, Grade 2 ? ?PAIN:  ?Are you having pain? 3/10 deltoid region ?Worse: wall walking  ?Better: ibuprofen, rest, self massage ? ?PRECAUTIONS: Left UE lymphedema risk ? 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 ?LEISURE: walking, stretching, I usually do push ups  ? ?HAND DOMINANCE : right  ? ?PRIOR LEVEL OF FUNCTION: Independent ? ?PATIENT GOALS Can I get back to work  ? ? ?OBJECTIVE ? ?UPPER EXTREMITY AROM/PROM: ? ?A/PROM RIGHT 08/04/21 ?  ?Shoulder extension 70  ?Shoulder flexion 157  ?Shoulder abduction 165  ?Shoulder internal rotation   ?Shoulder external rotation 90  ?  (Blank rows = not tested) ? ?A/PROM LEFT  08/04/21  ?Shoulder extension 62  ?Shoulder flexion 145  ?Shoulder abduction 145 - chest tightness  ?Shoulder internal rotation   ?Shoulder external rotation 90  ?  (Blank rows = not tested) ? ? ?LYMPHEDEMA ASSESSMENTS:  ? ?[place] RIGHT  08/11/2021  ?10 cm proximal to olecranon process 27.5  ?Olecranon process 23.8  ?10 cm proximal to ulnar styloid process 19  ?Just proximal to ulnar styloid process 16  ?Across hand at thumb web space 18  ?At base of 2nd digit 5.7  ?(Blank rows = not tested) ? ?[place] LEFT  08/11/2021  ?10 cm proximal to olecranon process 28.7  ?Olecranon process 24.5  ?10 cm proximal to ulnar styloid process 20.0  ?Just proximal to ulnar styloid  process 15.3  ?Across hand at thumb web space 18  ?At base of 2nd digit 5.7  ?(Blank rows = not tested) ? ?QUICK DASH SURVEY: 9% ? ?TODAY'S TREATMENT  ?08/11/21 ?Rechecked AROM and noted some cording in the left axilla - mild after full inspection supine but there.  Pt also noting some pull into the forearm but also not visible.  Gave pt small tg soft with large fold with education on using this for compression.  Also measured and gave pt ordering info for juzo dynamic size 2 regular 20-30 and size Small gauntlet in case pt wants to self order, but she is okay waiting for insurance check.  Emailed Romeo Rabon from [employer] as we have not gotten benefits back yet.   ?Performed SOZO which was 7.1 demonstrating increased fluid in the Lt UE slightly but WNL - in line with radiation and new onset of cording.  Added this as a baseline so if pt needs to recheck for progress or progression in the future.   ?Educated pt on self MFR with active flexion and abduction and avoiding and movement that seems to pinch the top of the shoulder vs pulling in the axilla.   ?Supine PROM with axillary pinning - no STM as pt is leaving for radiation next.   ?Edited 08/15/21: Pt is covered 100% for [employer] needing to order 4 units.  Tried to call pt to submit order but no answer. Will talk to her next appt. (Ordering info is on PT desk) ?  ? ?08/04/21 ?Eval completed ?Education on post op stretches to start with performance of each and handout each performed x 2 with vcs and tcs for completion ?Brief education on lymphedema but will complete this next visit ?Gave pt script and info on bras ? ?PATIENT EDUCATION:  ?Education details: HEP, cording, how to order compression ?Person educated: Patient ?Education method: Explanation, Demonstration, Tactile cues, Verbal  cues, and Handouts ?Education comprehension: verbalized understanding, returned demonstration, and verbal cues required ? ? ?HOME EXERCISE PROGRAM: ?Post op sheet, self release of cording in  axilla ? ?ASSESSMENT: ? ?CLINICAL IMPRESSION:pt had noted some increased feelings of swelling in the upper arm and some more tenderness and pain. Noted some mild cording in the axilla and subjectively in the upper arm today wphich is new.  Measured pt for garments per above and will order through [employer] if covered or let pt know to self order - pt has ordering information.  Pt also having some signs of impingrment and neck pain now starting radiation and we will address those during MT and TE as needed.  ? ? ?OBJECTIVE IMPAIRMENTS decreased activity tolerance, decreased knowledge of use of DME, decreased ROM, and decreased strength.  ? ?ACTIVITY LIMITATIONS cleaning, occupation, and yard work.  ? ?PERSONAL FACTORS 1-2 comorbidities: ALND, mastectomy   are also affecting patient's functional outcome.  ? ? ?REHAB POTENTIAL: Excellent ? ?CLINICAL DECISION MAKING: Stable/uncomplicated ? ?EVALUATION COMPLEXITY: Low ? ?GOALS: ?Goals reviewed with patient? Yes ? ?SHORT TERM GOALS: ? ?Pt will be education on lymphedema risk reduction, surveillance, and how to use compression for work` ?Baseline:  ?Target date: 08/25/2021 ?Goal status: INITIAL ? ? ?LONG TERM GOALS: ? ?Pt will improve Lt shoulder AROM to within 5 deg of the Rt arm to demonstrate improved mobility and reach ?Baseline:  ?Target date: 09/22/2021 ?Goal status: INITIAL ? ?2.  Pt will be ind with final stretches and strengthening to get ready for return to work ?Baseline:  ?Target date: 09/22/2021 ?Goal status: INITIAL ? ? ?PLAN: ?PT FREQUENCY: 1x/week ? ?PT DURATION: 6 weeks ? ?PLANNED INTERVENTIONS: Therapeutic exercises, Therapeutic activity, Neuromuscular re-education, Patient/Family education, and Manual therapy ? ?PLAN FOR NEXT SESSION: lymphedema risk reduction education with handout (not able to do ABC class),  benefits back for sleeve vs self order?    Lt AAROM: pulleys, ball, PROM and STM to improve AROM, MT: cording/PROM/neck as neeed - eventually supine  scap as able ? ? ? ? ?Stark Bray, PT ?08/11/2021, 12:02 PM ? ?

## 2021-08-12 ENCOUNTER — Ambulatory Visit
Admission: RE | Admit: 2021-08-12 | Discharge: 2021-08-12 | Disposition: A | Discharge status: Home / Self Care | Payer: 59 | Source: Ambulatory Visit | Attending: Radiation Oncology | Admitting: Radiation Oncology

## 2021-08-12 DIAGNOSIS — C50412 Malignant neoplasm of upper-outer quadrant of left female breast: Secondary | ICD-10-CM

## 2021-08-12 DIAGNOSIS — Z170 Estrogen receptor positive status [ER+]: Secondary | ICD-10-CM | POA: Diagnosis not present

## 2021-08-12 DIAGNOSIS — Z510 Encounter for antineoplastic radiation therapy: Secondary | ICD-10-CM | POA: Diagnosis not present

## 2021-08-12 MED ORDER — RADIAPLEXRX EX GEL: Freq: Once | CUTANEOUS | Status: AC

## 2021-08-13 ENCOUNTER — Other Ambulatory Visit: Payer: Self-pay

## 2021-08-13 ENCOUNTER — Ambulatory Visit
Admission: RE | Admit: 2021-08-13 | Discharge: 2021-08-13 | Disposition: A | Discharge status: Home / Self Care | Payer: 59 | Source: Ambulatory Visit | Attending: Radiation Oncology | Admitting: Radiation Oncology

## 2021-08-13 DIAGNOSIS — Z170 Estrogen receptor positive status [ER+]: Secondary | ICD-10-CM | POA: Diagnosis not present

## 2021-08-13 DIAGNOSIS — Z510 Encounter for antineoplastic radiation therapy: Secondary | ICD-10-CM | POA: Diagnosis not present

## 2021-08-13 DIAGNOSIS — C50412 Malignant neoplasm of upper-outer quadrant of left female breast: Secondary | ICD-10-CM | POA: Diagnosis not present

## 2021-08-14 ENCOUNTER — Ambulatory Visit
Admission: RE | Admit: 2021-08-14 | Discharge: 2021-08-14 | Disposition: A | Discharge status: Home / Self Care | Payer: 59 | Source: Ambulatory Visit | Attending: Radiation Oncology | Admitting: Radiation Oncology

## 2021-08-14 DIAGNOSIS — C50412 Malignant neoplasm of upper-outer quadrant of left female breast: Secondary | ICD-10-CM | POA: Diagnosis not present

## 2021-08-14 DIAGNOSIS — Z170 Estrogen receptor positive status [ER+]: Secondary | ICD-10-CM | POA: Diagnosis not present

## 2021-08-14 DIAGNOSIS — Z510 Encounter for antineoplastic radiation therapy: Secondary | ICD-10-CM | POA: Diagnosis not present

## 2021-08-15 ENCOUNTER — Ambulatory Visit
Admission: RE | Admit: 2021-08-15 | Discharge: 2021-08-15 | Disposition: A | Discharge status: Home / Self Care | Payer: 59 | Source: Ambulatory Visit | Attending: Radiation Oncology | Admitting: Radiation Oncology

## 2021-08-15 ENCOUNTER — Other Ambulatory Visit: Payer: Self-pay

## 2021-08-15 ENCOUNTER — Telehealth: Payer: Self-pay | Admitting: Rehabilitation

## 2021-08-15 DIAGNOSIS — Z510 Encounter for antineoplastic radiation therapy: Secondary | ICD-10-CM | POA: Diagnosis not present

## 2021-08-15 DIAGNOSIS — C50412 Malignant neoplasm of upper-outer quadrant of left female breast: Secondary | ICD-10-CM | POA: Diagnosis not present

## 2021-08-15 DIAGNOSIS — Z170 Estrogen receptor positive status [ER+]: Secondary | ICD-10-CM | POA: Diagnosis not present

## 2021-08-15 NOTE — Telephone Encounter (Signed)
Calling to let pt know that her compression sleeve is 100% covered.  No answer.  ?

## 2021-08-18 ENCOUNTER — Ambulatory Visit
Admission: RE | Admit: 2021-08-18 | Discharge: 2021-08-18 | Disposition: A | Discharge status: Home / Self Care | Payer: 59 | Source: Ambulatory Visit | Attending: Radiation Oncology | Admitting: Radiation Oncology

## 2021-08-18 ENCOUNTER — Other Ambulatory Visit: Payer: Self-pay

## 2021-08-18 DIAGNOSIS — Z170 Estrogen receptor positive status [ER+]: Secondary | ICD-10-CM | POA: Diagnosis not present

## 2021-08-18 DIAGNOSIS — C50412 Malignant neoplasm of upper-outer quadrant of left female breast: Secondary | ICD-10-CM | POA: Diagnosis not present

## 2021-08-18 DIAGNOSIS — Z510 Encounter for antineoplastic radiation therapy: Secondary | ICD-10-CM | POA: Diagnosis not present

## 2021-08-19 ENCOUNTER — Ambulatory Visit
Admission: RE | Admit: 2021-08-19 | Discharge: 2021-08-19 | Disposition: A | Discharge status: Home / Self Care | Payer: 59 | Source: Ambulatory Visit | Attending: Radiation Oncology | Admitting: Radiation Oncology

## 2021-08-19 DIAGNOSIS — C50412 Malignant neoplasm of upper-outer quadrant of left female breast: Secondary | ICD-10-CM | POA: Diagnosis not present

## 2021-08-19 DIAGNOSIS — Z170 Estrogen receptor positive status [ER+]: Secondary | ICD-10-CM | POA: Diagnosis not present

## 2021-08-19 DIAGNOSIS — Z510 Encounter for antineoplastic radiation therapy: Secondary | ICD-10-CM | POA: Diagnosis not present

## 2021-08-20 ENCOUNTER — Other Ambulatory Visit: Payer: Self-pay

## 2021-08-20 ENCOUNTER — Ambulatory Visit
Admission: RE | Admit: 2021-08-20 | Discharge: 2021-08-20 | Disposition: A | Discharge status: Home / Self Care | Payer: 59 | Source: Ambulatory Visit | Attending: Radiation Oncology | Admitting: Radiation Oncology

## 2021-08-20 DIAGNOSIS — Z170 Estrogen receptor positive status [ER+]: Secondary | ICD-10-CM | POA: Diagnosis not present

## 2021-08-20 DIAGNOSIS — C50412 Malignant neoplasm of upper-outer quadrant of left female breast: Secondary | ICD-10-CM | POA: Diagnosis not present

## 2021-08-20 DIAGNOSIS — Z510 Encounter for antineoplastic radiation therapy: Secondary | ICD-10-CM | POA: Diagnosis not present

## 2021-08-21 ENCOUNTER — Ambulatory Visit
Admission: RE | Admit: 2021-08-21 | Discharge: 2021-08-21 | Disposition: A | Discharge status: Home / Self Care | Payer: 59 | Source: Ambulatory Visit | Attending: Radiation Oncology | Admitting: Radiation Oncology

## 2021-08-21 DIAGNOSIS — Z510 Encounter for antineoplastic radiation therapy: Secondary | ICD-10-CM | POA: Diagnosis not present

## 2021-08-21 DIAGNOSIS — Z170 Estrogen receptor positive status [ER+]: Secondary | ICD-10-CM | POA: Diagnosis not present

## 2021-08-21 DIAGNOSIS — C50412 Malignant neoplasm of upper-outer quadrant of left female breast: Secondary | ICD-10-CM | POA: Diagnosis not present

## 2021-08-22 ENCOUNTER — Ambulatory Visit: Payer: 59 | Admitting: Physical Therapy

## 2021-08-22 ENCOUNTER — Other Ambulatory Visit: Payer: Self-pay

## 2021-08-22 ENCOUNTER — Ambulatory Visit
Admission: RE | Admit: 2021-08-22 | Discharge: 2021-08-22 | Disposition: A | Discharge status: Home / Self Care | Payer: 59 | Source: Ambulatory Visit | Attending: Radiation Oncology | Admitting: Radiation Oncology

## 2021-08-22 ENCOUNTER — Encounter: Payer: Self-pay | Admitting: Physical Therapy

## 2021-08-22 DIAGNOSIS — Z510 Encounter for antineoplastic radiation therapy: Secondary | ICD-10-CM | POA: Diagnosis not present

## 2021-08-22 DIAGNOSIS — Z170 Estrogen receptor positive status [ER+]: Secondary | ICD-10-CM | POA: Diagnosis not present

## 2021-08-22 DIAGNOSIS — C50412 Malignant neoplasm of upper-outer quadrant of left female breast: Secondary | ICD-10-CM | POA: Diagnosis not present

## 2021-08-22 DIAGNOSIS — Z483 Aftercare following surgery for neoplasm: Secondary | ICD-10-CM

## 2021-08-22 NOTE — Therapy (Signed)
?OUTPATIENT PHYSICAL THERAPY TREATMENT NOTE ? ? ?Patient Name: Brenda Waters ?MRN: [id number] ?DOB:[birth year], 56 y.o., female ?Today's Date: 08/22/2021 ? ?PCP: Almedia Balls, NP ?REFERRING PROVIDER: Stark Klein, MD ? ?END OF SESSION:  ? PT End of Session - 08/22/21 0946   ? ? Visit Number 3   ? Number of Visits 6   ? Date for PT Re-Evaluation 09/15/21   ? PT Start Time [id number]   ? PT Stop Time 1041   ? PT Time Calculation (min) 55 min   ? ?  ?  ? ?  ? ? ?Past Medical History:  ?Diagnosis Date  ? Breast cancer ([place])   ? Left  ? GERD (gastroesophageal reflux disease)   ? diet controlled  ? Heart murmur   ? no current problems  ? Hypertension   ? Mitral valve prolapse   ? ?Past Surgical History:  ?Procedure Laterality Date  ? CESAREAN SECTION    ? x 2 in 1991 and 1993  ? MASTECTOMY W/ SENTINEL NODE BIOPSY Left 06/12/2021  ? Procedure: LEFT MASTECTOMY WITH SENTINEL LYMPH NODE BIOPSY;  Surgeon: Stark Klein, MD;  Location: [place];  Service: General;  Laterality: Left;  ? NODE DISSECTION Left 07/02/2021  ? Procedure: LEFT AXILLARY LYMPH NODE DISSECTION;  Surgeon: Stark Klein, MD;  Location: [place];  Service: General;  Laterality: Left;  ? [place]  ? WISDOM TOOTH EXTRACTION    ? ?Patient Active Problem List  ? Diagnosis Date Noted  ? Breast cancer of upper-outer quadrant of left female breast ([place]) 06/12/2021  ? Genetic testing 04/23/2021  ? Malignant neoplasm of upper-outer quadrant of left breast in female, estrogen receptor positive ([place]) 04/21/2021  ? ? ?REFERRING DIAG:  C50.412,Z17.0 (ICD-10-CM) - Malignant neoplasm of upper-outer quadrant of left breast in female, estrogen receptor positive ([place]) ? ?THERAPY DIAG:  ?Malignant neoplasm of upper-outer quadrant of left breast in female, estrogen receptor positive ([place]) ? ?Aftercare following surgery for neoplasm ? ?PERTINENT HISTORY: S/p left mastectomy on 06/12/21 with Dr. Barry Dienes 3/5 Lymph nodes positive. ALND on 07/02/21 with 14/14 lymph nodes negative. Drain  removed 07/18/21. Will be starting radiation: Stage IB (cT2, cN0, cM0) Left Breast UOQ, Invasive pleomorphic lobular carcinoma with LCIS, ER+ / PR+ / Her2-, Grade 2 ?  ? ?PRECAUTIONS: at risk for lymphedema  ? ?SUBJECTIVE: Pt reports she is feeling well.  She is not having any pain and has been wearing the tg soft except for when she is sleeping. She is continuing with radiation until may 8 . She is doing her ROM exercises 2 times a day but still feels some tightness in her shoulder when she reaches overhead  ? ?PAIN:  ?Are you having pain? No ? ? ? ? ? ? ?Past Medical History:  ?Diagnosis Date  ? Breast cancer ([place])   ? Left  ? GERD (gastroesophageal reflux disease)   ? diet controlled  ? Heart murmur   ? no current problems  ? Hypertension   ? Mitral valve prolapse   ? ?Past Surgical History:  ?Procedure Laterality Date  ? CESAREAN SECTION    ? x 2 in 1991 and 1993  ? MASTECTOMY W/ SENTINEL NODE BIOPSY Left 06/12/2021  ? Procedure: LEFT MASTECTOMY WITH SENTINEL LYMPH NODE BIOPSY;  Surgeon: Stark Klein, MD;  Location: [place];  Service: General;  Laterality: Left;  ? NODE DISSECTION Left 07/02/2021  ? Procedure: LEFT AXILLARY LYMPH NODE DISSECTION;  Surgeon: Stark Klein, MD;  Location: [place];  Service: General;  Laterality: Left;  ? [place]  ? WISDOM TOOTH EXTRACTION    ? ?Patient Active Problem List  ? Diagnosis Date Noted  ? Breast cancer of upper-outer quadrant of left female breast ([place]) 06/12/2021  ? Genetic testing 04/23/2021  ? Malignant neoplasm of upper-outer quadrant of left breast in female, estrogen receptor positive ([place]) 04/21/2021  ? ? ?PCP: Almedia Balls, NP ? ?REFERRING PROVIDER: Stark Klein, MD ? ?REFERRING DIAG: C50.412,Z17.0 (ICD-10-CM) - Malignant neoplasm of upper-outer quadrant of left breast in female, estrogen receptor positive ([place]) ? ?THERAPY DIAG:  ?Malignant neoplasm of upper-outer quadrant of left breast in female, estrogen receptor positive ([place]) ? ?Aftercare following  surgery for neoplasm ? ?ONSET DATE: 06/12/21 ? ?SUBJECTIVE                                                                                                                                                                                          ? ?SUBJECTIVE STATEMENT:   ?Pt says she is doing well.  She continues with radiation and will complete on May 8.  She is wearing her tg soft duing the day  ? ?PERTINENT HISTORY:  ?S/p left mastectomy on 06/12/21 with Dr. Barry Dienes 3/5 Lymph nodes positive. ALND on 07/02/21 with 14/14 lymph nodes negative. Drain removed 07/18/21. Will be starting radiation: Stage IB (cT2, cN0, cM0) Left Breast UOQ, Invasive pleomorphic lobular carcinoma with LCIS, ER+ / PR+ / Her2-, Grade 2 ? ?PAIN:  ?Are you having pain? No ? ?PRECAUTIONS: Left UE lymphedema risk ? 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 ?LEISURE: walking, stretching, "I usually do push ups" (4/14:pt advised to stop doing wall push ups during radiation ) ? ?HAND DOMINANCE : right  ? ?PRIOR LEVEL OF FUNCTION: Independent ? ?PATIENT GOALS Can I get back to work  ? ? ?OBJECTIVE ? ?UPPER EXTREMITY AROM/PROM: ? ?  (Blank rows = not tested) ? ?A/PROM LEFT  08/04/21 08/22/2021  ?Shoulder extension 62   ?Shoulder flexion 145 138  ?Shoulder abduction 145 - chest tightness 140 chest tightness  ?Shoulder internal rotation    ?Shoulder external rotation 90   ?  (Blank rows = not tested) ? ? ?LYMPHEDEMA ASSESSMENTS: Pt has visible fullness in left chest above and below incision with some scabbed areas along incision line . She states she uses radiaplex twice a day. Pt had  red indentations from band of compression bra along chest. Adjusted bra to lengthen chest band and tighten strap so bra will cover more of upper lateral chest fullness.Added a piece of  tg soft to pad chest strap also and pt will visually inspect her chest when she gets home.  She was advised to stop wearing compression  bra til a few weeks after radiation to protect the skin in this area if redness from bra continues.  ? ? ? ?[place] LEFT  08/22/2021  ?10 cm proximal to olecranon process 29.5  ?Olecranon process 24.5  ?10 cm proximal to ulnar styloid process 22  ?Just proximal to ulnar styloid process 15.5  ?Across hand at thumb web space 18  ?At base of 2nd digit 5.8  ? Pt has had some minor increases since last assessment  ?In supine:  ? ? ? ?In sitting:  ? ? ? ?TODAY'S TREATMENT  ?08/22/2021: rechecked shoulder ROM and circumference measurements.  Pt still has tightness in left shoulder with decreased ROM and has some mild increase in measurements of left arm She has visible fullness around incision with radiation redness beginning and scabbed areas at incision especially in central areas. Performed manual lymph drainage with pt in supine avoiding radiaiton areas: short neck, diaphragmatic breathing, right axillary nodes and right sided anterior anastamosis to avoid pulling on incision. Left shoulder upper arm, lower and return along pathways. Then to sidelying for posterior interaxillay anastamosis Pt also did shoulder abduction in available range in this position along with shoulder circles with arm pointed to ceiling. Adjusted bra for pt to lengthen chest strap and provide more coverage at upper lateral chest. Asked pt to bring in cotton tank top and binders to next session to see if these will provide better compression with skin protection.  ? ?08/11/21 ?Rechecked AROM and noted some cording in the left axilla - mild after full inspection supine but there.  Pt also noting some pull into the forearm but also not visible.  Gave pt small tg soft with large fold with education on using this for compression.  Also measured and gave pt ordering info for juzo dynamic size 2 regular 20-30 and size Small gauntlet in case pt wants to self order, but she is okay waiting for insurance check.  Emailed Romeo Rabon from [employer] as we have not  gotten benefits back yet.   ?Performed SOZO which was 7.1 demonstrating increased fluid in the Lt UE slightly but WNL - in line with radiation and new onset of cording.  Added this as a baseline so i

## 2021-08-25 ENCOUNTER — Ambulatory Visit
Admission: RE | Admit: 2021-08-25 | Discharge: 2021-08-25 | Disposition: A | Discharge status: Home / Self Care | Payer: 59 | Source: Ambulatory Visit | Attending: Radiation Oncology | Admitting: Radiation Oncology

## 2021-08-25 ENCOUNTER — Other Ambulatory Visit: Payer: Self-pay

## 2021-08-25 DIAGNOSIS — Z510 Encounter for antineoplastic radiation therapy: Secondary | ICD-10-CM | POA: Diagnosis not present

## 2021-08-25 DIAGNOSIS — C50412 Malignant neoplasm of upper-outer quadrant of left female breast: Secondary | ICD-10-CM | POA: Diagnosis not present

## 2021-08-25 DIAGNOSIS — Z170 Estrogen receptor positive status [ER+]: Secondary | ICD-10-CM | POA: Diagnosis not present

## 2021-08-26 ENCOUNTER — Other Ambulatory Visit: Payer: Self-pay

## 2021-08-26 ENCOUNTER — Ambulatory Visit
Admission: RE | Admit: 2021-08-26 | Discharge: 2021-08-26 | Disposition: A | Discharge status: Home / Self Care | Payer: 59 | Source: Ambulatory Visit | Attending: Radiation Oncology | Admitting: Radiation Oncology

## 2021-08-26 ENCOUNTER — Encounter: Payer: Self-pay | Admitting: Rehabilitation

## 2021-08-26 ENCOUNTER — Ambulatory Visit: Payer: 59 | Admitting: Rehabilitation

## 2021-08-26 DIAGNOSIS — Z510 Encounter for antineoplastic radiation therapy: Secondary | ICD-10-CM | POA: Diagnosis not present

## 2021-08-26 DIAGNOSIS — C50412 Malignant neoplasm of upper-outer quadrant of left female breast: Secondary | ICD-10-CM | POA: Diagnosis not present

## 2021-08-26 DIAGNOSIS — Z483 Aftercare following surgery for neoplasm: Secondary | ICD-10-CM | POA: Diagnosis not present

## 2021-08-26 DIAGNOSIS — Z170 Estrogen receptor positive status [ER+]: Secondary | ICD-10-CM | POA: Diagnosis not present

## 2021-08-26 LAB — RAD ONC ARIA SESSION SUMMARY
Course Elapsed Days: 21
Plan Fractions Treated to Date: 16
Plan Fractions Treated to Date: 8
Plan Prescribed Dose Per Fraction: 2 Gy
Plan Prescribed Dose Per Fraction: 2 Gy
Plan Total Fractions Prescribed: 12
Plan Total Fractions Prescribed: 25
Plan Total Prescribed Dose: 24 Gy
Plan Total Prescribed Dose: 50 Gy
Reference Point Dosage Given to Date: 32 Gy
Reference Point Dosage Given to Date: 32 Gy
Reference Point Session Dosage Given: 2 Gy
Reference Point Session Dosage Given: 2 Gy
Session Number: 16

## 2021-08-26 NOTE — Therapy (Signed)
?OUTPATIENT PHYSICAL THERAPY TREATMENT NOTE ? ? ?Patient Name: Brenda Waters ?MRN: [id number] ?DOB:November 17, 1965, 56 y.o., female ?Today's Date: 08/26/2021 ? ?PCP: Almedia Balls, NP ?REFERRING PROVIDER: Stark Klein, MD ? ?END OF SESSION:  ? PT End of Session - 08/26/21 1101   ? ? Visit Number 4   ? Number of Visits 6   ? Date for PT Re-Evaluation 09/15/21   ? PT Start Time 1103   ? PT Stop Time 1147   ? PT Time Calculation (min) 44 min   ? Activity Tolerance Patient tolerated treatment well   ? Behavior During Therapy [facility] for tasks assessed/performed   ? ?  ?  ? ?  ? ? ?Past Medical History:  ?Diagnosis Date  ? Breast cancer ([place])   ? Left  ? GERD (gastroesophageal reflux disease)   ? diet controlled  ? Heart murmur   ? no current problems  ? Hypertension   ? Mitral valve prolapse   ? ?Past Surgical History:  ?Procedure Laterality Date  ? CESAREAN SECTION    ? x 2 in 1991 and 1993  ? MASTECTOMY W/ SENTINEL NODE BIOPSY Left 06/12/2021  ? Procedure: LEFT MASTECTOMY WITH SENTINEL LYMPH NODE BIOPSY;  Surgeon: Stark Klein, MD;  Location: [place];  Service: General;  Laterality: Left;  ? NODE DISSECTION Left 07/02/2021  ? Procedure: LEFT AXILLARY LYMPH NODE DISSECTION;  Surgeon: Stark Klein, MD;  Location: [place];  Service: General;  Laterality: Left;  ? [place]  ? WISDOM TOOTH EXTRACTION    ? ?Patient Active Problem List  ? Diagnosis Date Noted  ? Breast cancer of upper-outer quadrant of left female breast ([place]) 06/12/2021  ? Genetic testing 04/23/2021  ? Malignant neoplasm of upper-outer quadrant of left breast in female, estrogen receptor positive ([place]) 04/21/2021  ? ? ? ? ? ? ? ?Past Medical History:  ?Diagnosis Date  ? Breast cancer ([place])   ? Left  ? GERD (gastroesophageal reflux disease)   ? diet controlled  ? Heart murmur   ? no current problems  ? Hypertension   ? Mitral valve prolapse   ? ?Past Surgical History:  ?Procedure Laterality Date  ? CESAREAN SECTION    ? x 2 in 1991 and 1993  ? MASTECTOMY W/  SENTINEL NODE BIOPSY Left 06/12/2021  ? Procedure: LEFT MASTECTOMY WITH SENTINEL LYMPH NODE BIOPSY;  Surgeon: Stark Klein, MD;  Location: [place];  Service: General;  Laterality: Left;  ? NODE DISSECTION Left 07/02/2021  ? Procedure: LEFT AXILLARY LYMPH NODE DISSECTION;  Surgeon: Stark Klein, MD;  Location: [place];  Service: General;  Laterality: Left;  ? [place]  ? WISDOM TOOTH EXTRACTION    ? ?Patient Active Problem List  ? Diagnosis Date Noted  ? Breast cancer of upper-outer quadrant of left female breast ([place]) 06/12/2021  ? Genetic testing 04/23/2021  ? Malignant neoplasm of upper-outer quadrant of left breast in female, estrogen receptor positive ([place]) 04/21/2021  ? ? ?SUBJECTIVE                                                                                                                                                                                          ? ?  SUBJECTIVE STATEMENT:   ?The skin is starting to get red. I would like to order the sleeves.  ? ?PERTINENT HISTORY:  ?S/p left mastectomy on 06/12/21 with Dr. Barry Dienes 3/5 Lymph nodes positive. ALND on 07/02/21 with 14/14 lymph nodes negative. Drain removed 07/18/21. Will be starting radiation: Stage IB (cT2, cN0, cM0) Left Breast UOQ, Invasive pleomorphic lobular carcinoma with LCIS, ER+ / PR+ / Her2-, Grade 2 ? ?PAIN:  ?Are you having pain? No ? ?PRECAUTIONS: Left UE lymphedema risk ? 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 ?LEISURE: walking, stretching, "I usually do push ups" (4/14:pt advised to stop doing wall push ups during radiation ) ? ?HAND DOMINANCE : right  ? ?PRIOR LEVEL OF FUNCTION: Independent ? ?PATIENT GOALS Can I get back to work  ? ? ?OBJECTIVE ? ?UPPER EXTREMITY AROM/PROM: ? ?  (Blank rows = not tested) ? ?A/PROM LEFT  08/04/21 08/22/2021  ?Shoulder extension 62   ?Shoulder flexion 145 138  ?Shoulder abduction 145 - chest tightness 140 chest tightness  ?Shoulder  internal rotation    ?Shoulder external rotation 90   ?  (Blank rows = not tested) ? ? ?[place] LEFT  08/26/2021  ?10 cm proximal to olecranon process 29.5  ?Olecranon process 24.5  ?10 cm proximal to ulnar styloid process 22  ?Just proximal to ulnar styloid process 15.5  ?Across hand at thumb web space 18  ?At base of 2nd digit 5.8  ? Pt has had some minor increases since last assessment  ?In supine:  ? ? ? ?In sitting:  ? ? ? ?TODAY'S TREATMENT  ?08/26/21 ?Submitted order to [facility] via email per 08/10/21 note.   ?Discussed POC as pt is starting to get very red and irritated.   ?Discussed limiting stretches for now to allow for optimal wound healing going into boost ?In supine: Short neck, 5 diaphragmatic breaths, bil axillary nodes and establishment of interaxillary pathway, L inguinal nodes and establishment of axilloinguinal pathway, then L chest and UE working proximal to distal, moving fluid from upper inner arm outwards, and doing both sides of forearm moving fluid towards pathways spending extra time in any area of edema then retracing all steps. Avoiding radiation site as it starts to get red.  ? ?08/22/2021: rechecked shoulder ROM and circumference measurements.  Pt still has tightness in left shoulder with decreased ROM and has some mild increase in measurements of left arm She has visible fullness around incision with radiation redness beginning and scabbed areas at incision especially in central areas. Performed manual lymph drainage with pt in supine avoiding radiaiton areas: short neck, diaphragmatic breathing, right axillary nodes and right sided anterior anastamosis to avoid pulling on incision. Left shoulder upper arm, lower and return along pathways. Then to sidelying for posterior interaxillay anastamosis Pt also did shoulder abduction in available range in this position along with shoulder circles with arm pointed to ceiling. Adjusted bra for pt to lengthen chest strap and provide more coverage at  upper lateral chest. Asked pt to bring in cotton tank top and binders to next session to see if these will provide better compression with skin protection.  ? ?08/11/21 ?Rechecked AROM and noted some cording in the left axilla - mild after full inspection supine but there.  Pt also noting some pull into the forearm but also not visible.  Gave pt small tg soft with large fold with education on using this for compression.  Also measured and gave pt ordering info for juzo dynamic size 2 regular 20-30 and size Small gauntlet in case pt wants to self order, but she is okay waiting for insurance check.  Emailed Romeo Rabon from [facility] as we have not gotten benefits back yet.   ?Performed SOZO which was 7.1 demonstrating increased fluid in the Lt UE slightly but WNL - in line with radiation and new onset of cording.  Added this as a baseline so if pt needs to recheck for progress or progression in the future.   ?Educated pt on self MFR with active flexion and abduction and avoiding and movement that seems to pinch the top of the shoulder vs pulling in the axilla.   ?Supine PROM with axillary pinning - no STM as pt is leaving for radiation next.   ?Edited 08/15/21: Pt is covered 100% for [facility] needing to order 4 units.  Tried to call pt to submit order but no answer. Will talk to her next appt. (Ordering info is on PT desk) ?  ? ?08/04/21 ?Eval completed ?Education on post op stretches to start with performance of each and handout each performed x 2 with vcs and tcs for completion ?Brief education on lymphedema but will complete this next visit ?Gave pt script and info on bras ? ?PATIENT EDUCATION:  ?Education details: HEP, cording, how to order compression ?Person educated: Patient ?Education method: Explanation, Demonstration, Tactile cues, Verbal cues, and Handouts ?Education comprehension: verbalized understanding, returned demonstration, and verbal cues required ? ? ?HOME EXERCISE PROGRAM: ?Post op sheet, self release of  cording in axilla ? ?ASSESSMENT: ? ?CLINICAL IMPRESSION: Pt is starting to have redness and irritation from radiation and continued 2 spots of scabbing on the incision.  Performed MLD today but held on str

## 2021-08-27 ENCOUNTER — Ambulatory Visit
Admission: RE | Admit: 2021-08-27 | Discharge: 2021-08-27 | Disposition: A | Discharge status: Home / Self Care | Payer: 59 | Source: Ambulatory Visit | Attending: Radiation Oncology | Admitting: Radiation Oncology

## 2021-08-27 ENCOUNTER — Other Ambulatory Visit: Payer: Self-pay

## 2021-08-27 DIAGNOSIS — Z510 Encounter for antineoplastic radiation therapy: Secondary | ICD-10-CM | POA: Diagnosis not present

## 2021-08-27 DIAGNOSIS — Z170 Estrogen receptor positive status [ER+]: Secondary | ICD-10-CM | POA: Diagnosis not present

## 2021-08-27 DIAGNOSIS — C50412 Malignant neoplasm of upper-outer quadrant of left female breast: Secondary | ICD-10-CM | POA: Diagnosis not present

## 2021-08-27 LAB — RAD ONC ARIA SESSION SUMMARY
Course Elapsed Days: 22
Plan Fractions Treated to Date: 17
Plan Fractions Treated to Date: 9
Plan Prescribed Dose Per Fraction: 2 Gy
Plan Prescribed Dose Per Fraction: 2 Gy
Plan Total Fractions Prescribed: 13
Plan Total Fractions Prescribed: 25
Plan Total Prescribed Dose: 26 Gy
Plan Total Prescribed Dose: 50 Gy
Reference Point Dosage Given to Date: 34 Gy
Reference Point Dosage Given to Date: 34 Gy
Reference Point Session Dosage Given: 2 Gy
Reference Point Session Dosage Given: 2 Gy
Session Number: 17

## 2021-08-28 ENCOUNTER — Ambulatory Visit
Admission: RE | Admit: 2021-08-28 | Discharge: 2021-08-28 | Disposition: A | Discharge status: Home / Self Care | Payer: 59 | Source: Ambulatory Visit | Attending: Radiation Oncology | Admitting: Radiation Oncology

## 2021-08-28 ENCOUNTER — Other Ambulatory Visit: Payer: Self-pay

## 2021-08-28 DIAGNOSIS — C50412 Malignant neoplasm of upper-outer quadrant of left female breast: Secondary | ICD-10-CM | POA: Diagnosis not present

## 2021-08-28 DIAGNOSIS — Z170 Estrogen receptor positive status [ER+]: Secondary | ICD-10-CM | POA: Diagnosis not present

## 2021-08-28 DIAGNOSIS — Z510 Encounter for antineoplastic radiation therapy: Secondary | ICD-10-CM | POA: Diagnosis not present

## 2021-08-28 LAB — RAD ONC ARIA SESSION SUMMARY
Course Elapsed Days: 23
Plan Fractions Treated to Date: 18
Plan Fractions Treated to Date: 9
Plan Prescribed Dose Per Fraction: 2 Gy
Plan Prescribed Dose Per Fraction: 2 Gy
Plan Total Fractions Prescribed: 12
Plan Total Fractions Prescribed: 25
Plan Total Prescribed Dose: 24 Gy
Plan Total Prescribed Dose: 50 Gy
Reference Point Dosage Given to Date: 36 Gy
Reference Point Dosage Given to Date: 36 Gy
Reference Point Session Dosage Given: 2 Gy
Reference Point Session Dosage Given: 2 Gy
Session Number: 18

## 2021-08-29 ENCOUNTER — Other Ambulatory Visit: Payer: Self-pay

## 2021-08-29 ENCOUNTER — Ambulatory Visit
Admission: RE | Admit: 2021-08-29 | Discharge: 2021-08-29 | Disposition: A | Discharge status: Home / Self Care | Payer: 59 | Source: Ambulatory Visit | Attending: Radiation Oncology | Admitting: Radiation Oncology

## 2021-08-29 DIAGNOSIS — Z510 Encounter for antineoplastic radiation therapy: Secondary | ICD-10-CM | POA: Diagnosis not present

## 2021-08-29 DIAGNOSIS — Z170 Estrogen receptor positive status [ER+]: Secondary | ICD-10-CM | POA: Diagnosis not present

## 2021-08-29 DIAGNOSIS — C50412 Malignant neoplasm of upper-outer quadrant of left female breast: Secondary | ICD-10-CM | POA: Diagnosis not present

## 2021-08-29 LAB — RAD ONC ARIA SESSION SUMMARY
Course Elapsed Days: 24
Plan Fractions Treated to Date: 10
Plan Fractions Treated to Date: 19
Plan Prescribed Dose Per Fraction: 2 Gy
Plan Prescribed Dose Per Fraction: 2 Gy
Plan Total Fractions Prescribed: 13
Plan Total Fractions Prescribed: 25
Plan Total Prescribed Dose: 26 Gy
Plan Total Prescribed Dose: 50 Gy
Reference Point Dosage Given to Date: 38 Gy
Reference Point Dosage Given to Date: 38 Gy
Reference Point Session Dosage Given: 2 Gy
Reference Point Session Dosage Given: 2 Gy
Session Number: 19

## 2021-09-01 ENCOUNTER — Ambulatory Visit
Admission: RE | Admit: 2021-09-01 | Discharge: 2021-09-01 | Disposition: A | Discharge status: Home / Self Care | Payer: 59 | Source: Ambulatory Visit | Attending: Radiation Oncology | Admitting: Radiation Oncology

## 2021-09-01 ENCOUNTER — Other Ambulatory Visit: Payer: Self-pay

## 2021-09-01 DIAGNOSIS — C50412 Malignant neoplasm of upper-outer quadrant of left female breast: Secondary | ICD-10-CM | POA: Diagnosis not present

## 2021-09-01 DIAGNOSIS — Z170 Estrogen receptor positive status [ER+]: Secondary | ICD-10-CM | POA: Diagnosis not present

## 2021-09-01 DIAGNOSIS — Z510 Encounter for antineoplastic radiation therapy: Secondary | ICD-10-CM | POA: Diagnosis not present

## 2021-09-01 LAB — RAD ONC ARIA SESSION SUMMARY
Course Elapsed Days: 27
Plan Fractions Treated to Date: 10
Plan Fractions Treated to Date: 20
Plan Prescribed Dose Per Fraction: 2 Gy
Plan Prescribed Dose Per Fraction: 2 Gy
Plan Total Fractions Prescribed: 12
Plan Total Fractions Prescribed: 25
Plan Total Prescribed Dose: 24 Gy
Plan Total Prescribed Dose: 50 Gy
Reference Point Dosage Given to Date: 40 Gy
Reference Point Dosage Given to Date: 40 Gy
Reference Point Session Dosage Given: 2 Gy
Reference Point Session Dosage Given: 2 Gy
Session Number: 20

## 2021-09-02 ENCOUNTER — Ambulatory Visit
Admission: RE | Admit: 2021-09-02 | Discharge: 2021-09-02 | Disposition: A | Discharge status: Home / Self Care | Payer: 59 | Source: Ambulatory Visit | Attending: Radiation Oncology | Admitting: Radiation Oncology

## 2021-09-02 ENCOUNTER — Ambulatory Visit: Payer: 59 | Admitting: Radiation Oncology

## 2021-09-02 ENCOUNTER — Ambulatory Visit: Payer: 59 | Admitting: Rehabilitation

## 2021-09-02 ENCOUNTER — Encounter: Payer: Self-pay | Admitting: Rehabilitation

## 2021-09-02 ENCOUNTER — Other Ambulatory Visit: Payer: Self-pay

## 2021-09-02 DIAGNOSIS — Z510 Encounter for antineoplastic radiation therapy: Secondary | ICD-10-CM | POA: Diagnosis not present

## 2021-09-02 DIAGNOSIS — Z483 Aftercare following surgery for neoplasm: Secondary | ICD-10-CM

## 2021-09-02 DIAGNOSIS — C50412 Malignant neoplasm of upper-outer quadrant of left female breast: Secondary | ICD-10-CM | POA: Diagnosis not present

## 2021-09-02 DIAGNOSIS — Z170 Estrogen receptor positive status [ER+]: Secondary | ICD-10-CM | POA: Diagnosis not present

## 2021-09-02 LAB — RAD ONC ARIA SESSION SUMMARY
Course Elapsed Days: 28
Plan Fractions Treated to Date: 11
Plan Prescribed Dose Per Fraction: 2 Gy
Plan Total Fractions Prescribed: 13
Plan Total Prescribed Dose: 26 Gy
Reference Point Dosage Given to Date: 42 Gy
Reference Point Session Dosage Given: 2 Gy
Session Number: 21

## 2021-09-02 MED ORDER — RADIAPLEXRX EX GEL: Freq: Once | CUTANEOUS | Status: AC

## 2021-09-02 NOTE — Therapy (Signed)
?OUTPATIENT PHYSICAL THERAPY TREATMENT NOTE ? ? ?Patient Name: Brenda Waters ?MRN: [id number] ?DOB:Mar 31, 1966, 56 y.o., female ?Today's Date: 09/02/2021 ? ?PCP: Almedia Balls, NP ?REFERRING PROVIDER: Stark Klein, MD ? ?END OF SESSION:  ? PT End of Session - 09/02/21 1050   ? ? Visit Number 5   ? Number of Visits 6   ? Date for PT Re-Evaluation 09/15/21   ? PT Start Time 1055   ? PT Stop Time 3790   ? PT Time Calculation (min) 17 min   ? Activity Tolerance Patient tolerated treatment well   ? Behavior During Therapy [facility] for tasks assessed/performed   ? ?  ?  ? ?  ? ? ?Past Medical History:  ?Diagnosis Date  ? Breast cancer ([place])   ? Left  ? GERD (gastroesophageal reflux disease)   ? diet controlled  ? Heart murmur   ? no current problems  ? Hypertension   ? Mitral valve prolapse   ? ?Past Surgical History:  ?Procedure Laterality Date  ? CESAREAN SECTION    ? x 2 in 1991 and 1993  ? MASTECTOMY W/ SENTINEL NODE BIOPSY Left 06/12/2021  ? Procedure: LEFT MASTECTOMY WITH SENTINEL LYMPH NODE BIOPSY;  Surgeon: Stark Klein, MD;  Location: [place];  Service: General;  Laterality: Left;  ? NODE DISSECTION Left 07/02/2021  ? Procedure: LEFT AXILLARY LYMPH NODE DISSECTION;  Surgeon: Stark Klein, MD;  Location: [place];  Service: General;  Laterality: Left;  ? [place]  ? WISDOM TOOTH EXTRACTION    ? ?Patient Active Problem List  ? Diagnosis Date Noted  ? Breast cancer of upper-outer quadrant of left female breast ([place]) 06/12/2021  ? Genetic testing 04/23/2021  ? Malignant neoplasm of upper-outer quadrant of left breast in female, estrogen receptor positive ([place]) 04/21/2021  ? ? ? ? ? ? ? ?Past Medical History:  ?Diagnosis Date  ? Breast cancer ([place])   ? Left  ? GERD (gastroesophageal reflux disease)   ? diet controlled  ? Heart murmur   ? no current problems  ? Hypertension   ? Mitral valve prolapse   ? ?Past Surgical History:  ?Procedure Laterality Date  ? CESAREAN SECTION    ? x 2 in 1991 and 1993  ? MASTECTOMY W/  SENTINEL NODE BIOPSY Left 06/12/2021  ? Procedure: LEFT MASTECTOMY WITH SENTINEL LYMPH NODE BIOPSY;  Surgeon: Stark Klein, MD;  Location: [place];  Service: General;  Laterality: Left;  ? NODE DISSECTION Left 07/02/2021  ? Procedure: LEFT AXILLARY LYMPH NODE DISSECTION;  Surgeon: Stark Klein, MD;  Location: [place];  Service: General;  Laterality: Left;  ? [place]  ? WISDOM TOOTH EXTRACTION    ? ?Patient Active Problem List  ? Diagnosis Date Noted  ? Breast cancer of upper-outer quadrant of left female breast ([place]) 06/12/2021  ? Genetic testing 04/23/2021  ? Malignant neoplasm of upper-outer quadrant of left breast in female, estrogen receptor positive ([place]) 04/21/2021  ? ? ?SUBJECTIVE                                                                                                                                                                                          ? ?  SUBJECTIVE STATEMENT:   ? ? ?PERTINENT HISTORY:  ?S/p left mastectomy on 06/12/21 with Dr. Barry Dienes 3/5 Lymph nodes positive. ALND on 07/02/21 with 14/14 lymph nodes negative. Drain removed 07/18/21. Will be starting radiation: Stage IB (cT2, cN0, cM0) Left Breast UOQ, Invasive pleomorphic lobular carcinoma with LCIS, ER+ / PR+ / Her2-, Grade 2 ? ?PAIN:  ?Are you having pain?  ? ?PRECAUTIONS: Left UE lymphedema risk ? 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 ?LEISURE: walking, stretching, "I usually do push ups" (4/14:pt advised to stop doing wall push ups during radiation ) ? ?HAND DOMINANCE : right  ? ?PRIOR LEVEL OF FUNCTION: Independent ? ?PATIENT GOALS Can I get back to work  ? ? ?OBJECTIVE ? ?UPPER EXTREMITY AROM/PROM: ? ?  (Blank rows = not tested) ? ?A/PROM LEFT  08/04/21 08/22/2021  ?Shoulder extension 62   ?Shoulder flexion 145 138  ?Shoulder abduction 145 - chest tightness 140 chest tightness  ?Shoulder internal rotation    ?Shoulder external rotation 90   ?  (Blank rows =  not tested) ? ? ?[place] LEFT  09/02/2021  ?10 cm proximal to olecranon process 29.5  ?Olecranon process 24.5  ?10 cm proximal to ulnar styloid process 22  ?Just proximal to ulnar styloid process 15.5  ?Across hand at thumb web space 18  ?At base of 2nd digit 5.8  ? Pt has had some minor increases since last assessment  ?In supine:  ? ? ? ?In sitting:  ? ? ? 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  Held on MLD and treatment today.  Pt did bring new sleeve and gauntlet which seem to fit very well.  Pt will wear as needed and was educated on wear, donning, washing, and use with activity  ? ?08/26/21 ?[facility] to [facility] via email per 08/10/21 note.   ?Discussed POC as pt is starting to get very red and irritated.   ?Discussed limiting stretches for now to allow for optimal wound healing going into boost ?In supine: Short neck, 5 diaphragmatic breaths, bil axillary nodes and establishment of interaxillary pathway, L inguinal nodes and establishment of axilloinguinal pathway, then L chest and UE working proximal to distal, moving fluid from upper inner arm outwards, and doing both sides of forearm moving fluid towards pathways spending extra time in any area of edema then retracing all steps. Avoiding radiation site as it starts to get red.  ? ?08/22/2021: rechecked shoulder ROM and circumference measurements.  Pt still has tightness in left shoulder with decreased ROM and has some mild increase in measurements of left arm She has visible fullness around incision with radiation redness beginning and scabbed areas at incision especially in central areas. Performed manual lymph drainage with pt in supine avoiding radiaiton areas: short neck, diaphragmatic breathing, right axillary nodes and right sided anterior anastamosis to avoid pulling on incision. Left shoulder upper arm, lower and return along pathways. Then to sidelying for  posterior interaxillay anastamosis Pt also did shoulder abduction in available range in this position along with shoulder circles with arm pointed to ceiling. Adjusted bra for pt to lengthen chest strap and provide more coverage at upper lateral chest. Asked pt to bring in cotton tank top and binders to next session to see if these will provide better compression with skin protection.  ? ?08/04/21 ?Eval completed ?Education  on post op stretches to start with performance of each and handout each performed x 2 with vcs and tcs for completion ?Brief education on lymphedema but will complete this next visit ?Gave pt script and info on bras ? ?PATIENT EDUCATION:  ?Education details: HEP, cording, how to order compression ?Person educated: Patient ?Education method: Explanation, Demonstration, Tactile cues, Verbal cues, and Handouts ?Education comprehension: verbalized understanding, returned demonstration, and verbal cues required ? ? ?HOME EXERCISE PROGRAM: ?Post op sheet, self release of cording in axilla ? ?ASSESSMENT: ? ?CLINICAL IMPRESSION: Pt is starting to have sig redness and irritation from radiation Will resume around 10/01/21. ? ?OBJECTIVE IMPAIRMENTS decreased activity tolerance, decreased knowledge of use of DME, decreased ROM, and decreased strength.  ? ?ACTIVITY LIMITATIONS cleaning, occupation, and yard work.  ? ?PERSONAL FACTORS 1-2 comorbidities: ALND, mastectomy   are also affecting patient's functional outcome.  ? ? ?REHAB POTENTIAL: Excellent ? ?CLINICAL DECISION MAKING: Stable/uncomplicated ? ?EVALUATION COMPLEXITY: Low ? ?GOALS: ?Goals reviewed with patient? Yes ? ?SHORT TERM GOALS: ? ?Pt will be education on lymphedema risk reduction, surveillance, and how to use compression for work` ?Baseline:  ?Target date: 09/16/2021 ?Goal status: ongoing  ? ? ?LONG TERM GOALS: ? ?Pt will improve Lt shoulder AROM to within 5 deg of the Rt arm to demonstrate improved mobility and reach ?Baseline:  ?Target date:  10/14/2021 ?Goal status: ongoing  ? ?2.  Pt will be ind with final stretches and strengthening to get ready for return to work ?Baseline:  ?Target date: 10/14/2021 ?Goal status: ongoing ? ? ?PLAN: ?PT FREQUENCY:

## 2021-09-03 ENCOUNTER — Other Ambulatory Visit: Payer: Self-pay

## 2021-09-03 ENCOUNTER — Ambulatory Visit
Admission: RE | Admit: 2021-09-03 | Discharge: 2021-09-03 | Disposition: A | Discharge status: Home / Self Care | Payer: 59 | Source: Ambulatory Visit | Attending: Radiation Oncology | Admitting: Radiation Oncology

## 2021-09-03 DIAGNOSIS — Z510 Encounter for antineoplastic radiation therapy: Secondary | ICD-10-CM | POA: Diagnosis not present

## 2021-09-03 DIAGNOSIS — Z483 Aftercare following surgery for neoplasm: Secondary | ICD-10-CM | POA: Diagnosis not present

## 2021-09-03 DIAGNOSIS — Z170 Estrogen receptor positive status [ER+]: Secondary | ICD-10-CM | POA: Diagnosis not present

## 2021-09-03 DIAGNOSIS — C50412 Malignant neoplasm of upper-outer quadrant of left female breast: Secondary | ICD-10-CM | POA: Diagnosis not present

## 2021-09-03 LAB — RAD ONC ARIA SESSION SUMMARY
Course Elapsed Days: 29
Plan Fractions Treated to Date: 11
Plan Fractions Treated to Date: 22
Plan Prescribed Dose Per Fraction: 2 Gy
Plan Prescribed Dose Per Fraction: 2 Gy
Plan Total Fractions Prescribed: 12
Plan Total Fractions Prescribed: 25
Plan Total Prescribed Dose: 24 Gy
Plan Total Prescribed Dose: 50 Gy
Reference Point Dosage Given to Date: 44 Gy
Reference Point Dosage Given to Date: 44 Gy
Reference Point Session Dosage Given: 2 Gy
Reference Point Session Dosage Given: 2 Gy
Session Number: 22

## 2021-09-04 ENCOUNTER — Other Ambulatory Visit: Payer: Self-pay

## 2021-09-04 ENCOUNTER — Ambulatory Visit
Admission: RE | Admit: 2021-09-04 | Discharge: 2021-09-04 | Disposition: A | Discharge status: Home / Self Care | Payer: 59 | Source: Ambulatory Visit | Attending: Radiation Oncology | Admitting: Radiation Oncology

## 2021-09-04 DIAGNOSIS — Z483 Aftercare following surgery for neoplasm: Secondary | ICD-10-CM | POA: Diagnosis not present

## 2021-09-04 DIAGNOSIS — Z510 Encounter for antineoplastic radiation therapy: Secondary | ICD-10-CM | POA: Diagnosis not present

## 2021-09-04 DIAGNOSIS — C50412 Malignant neoplasm of upper-outer quadrant of left female breast: Secondary | ICD-10-CM | POA: Diagnosis not present

## 2021-09-04 DIAGNOSIS — Z170 Estrogen receptor positive status [ER+]: Secondary | ICD-10-CM | POA: Diagnosis not present

## 2021-09-04 LAB — RAD ONC ARIA SESSION SUMMARY
Course Elapsed Days: 30
Plan Fractions Treated to Date: 12
Plan Fractions Treated to Date: 23
Plan Prescribed Dose Per Fraction: 2 Gy
Plan Prescribed Dose Per Fraction: 2 Gy
Plan Total Fractions Prescribed: 13
Plan Total Fractions Prescribed: 25
Plan Total Prescribed Dose: 26 Gy
Plan Total Prescribed Dose: 50 Gy
Reference Point Dosage Given to Date: 46 Gy
Reference Point Dosage Given to Date: 46 Gy
Reference Point Session Dosage Given: 2 Gy
Reference Point Session Dosage Given: 2 Gy
Session Number: 23

## 2021-09-05 ENCOUNTER — Ambulatory Visit
Admission: RE | Admit: 2021-09-05 | Discharge: 2021-09-05 | Disposition: A | Discharge status: Home / Self Care | Payer: 59 | Source: Ambulatory Visit | Attending: Radiation Oncology | Admitting: Radiation Oncology

## 2021-09-05 ENCOUNTER — Other Ambulatory Visit: Payer: Self-pay

## 2021-09-05 DIAGNOSIS — Z170 Estrogen receptor positive status [ER+]: Secondary | ICD-10-CM | POA: Diagnosis not present

## 2021-09-05 DIAGNOSIS — C50412 Malignant neoplasm of upper-outer quadrant of left female breast: Secondary | ICD-10-CM | POA: Diagnosis not present

## 2021-09-05 DIAGNOSIS — Z510 Encounter for antineoplastic radiation therapy: Secondary | ICD-10-CM | POA: Diagnosis not present

## 2021-09-05 LAB — RAD ONC ARIA SESSION SUMMARY
Course Elapsed Days: 31
Plan Fractions Treated to Date: 12
Plan Fractions Treated to Date: 24
Plan Prescribed Dose Per Fraction: 2 Gy
Plan Prescribed Dose Per Fraction: 2 Gy
Plan Total Fractions Prescribed: 12
Plan Total Fractions Prescribed: 25
Plan Total Prescribed Dose: 24 Gy
Plan Total Prescribed Dose: 50 Gy
Reference Point Dosage Given to Date: 48 Gy
Reference Point Dosage Given to Date: 48 Gy
Reference Point Session Dosage Given: 2 Gy
Reference Point Session Dosage Given: 2 Gy
Session Number: 24

## 2021-09-08 ENCOUNTER — Other Ambulatory Visit: Payer: Self-pay

## 2021-09-08 ENCOUNTER — Ambulatory Visit
Admission: RE | Admit: 2021-09-08 | Discharge: 2021-09-08 | Disposition: A | Discharge status: Home / Self Care | Payer: 59 | Source: Ambulatory Visit | Attending: Radiation Oncology | Admitting: Radiation Oncology

## 2021-09-08 DIAGNOSIS — Z170 Estrogen receptor positive status [ER+]: Secondary | ICD-10-CM

## 2021-09-08 DIAGNOSIS — Z79811 Long term (current) use of aromatase inhibitors: Secondary | ICD-10-CM | POA: Diagnosis not present

## 2021-09-08 DIAGNOSIS — C50412 Malignant neoplasm of upper-outer quadrant of left female breast: Secondary | ICD-10-CM | POA: Insufficient documentation

## 2021-09-08 DIAGNOSIS — Z79899 Other long term (current) drug therapy: Secondary | ICD-10-CM | POA: Diagnosis not present

## 2021-09-08 DIAGNOSIS — F17210 Nicotine dependence, cigarettes, uncomplicated: Secondary | ICD-10-CM | POA: Diagnosis not present

## 2021-09-08 DIAGNOSIS — Z510 Encounter for antineoplastic radiation therapy: Secondary | ICD-10-CM | POA: Diagnosis not present

## 2021-09-08 LAB — RAD ONC ARIA SESSION SUMMARY
Course Elapsed Days: 34
Plan Fractions Treated to Date: 13
Plan Fractions Treated to Date: 25
Plan Prescribed Dose Per Fraction: 2 Gy
Plan Prescribed Dose Per Fraction: 2 Gy
Plan Total Fractions Prescribed: 13
Plan Total Fractions Prescribed: 25
Plan Total Prescribed Dose: 26 Gy
Plan Total Prescribed Dose: 50 Gy
Reference Point Dosage Given to Date: 50 Gy
Reference Point Dosage Given to Date: 50 Gy
Reference Point Session Dosage Given: 2 Gy
Reference Point Session Dosage Given: 2 Gy
Session Number: 25

## 2021-09-09 ENCOUNTER — Other Ambulatory Visit: Payer: Self-pay

## 2021-09-09 ENCOUNTER — Ambulatory Visit
Admission: RE | Admit: 2021-09-09 | Discharge: 2021-09-09 | Disposition: A | Discharge status: Home / Self Care | Payer: 59 | Source: Ambulatory Visit | Attending: Radiation Oncology | Admitting: Radiation Oncology

## 2021-09-09 DIAGNOSIS — Z79811 Long term (current) use of aromatase inhibitors: Secondary | ICD-10-CM | POA: Diagnosis not present

## 2021-09-09 DIAGNOSIS — Z79899 Other long term (current) drug therapy: Secondary | ICD-10-CM | POA: Diagnosis not present

## 2021-09-09 DIAGNOSIS — Z510 Encounter for antineoplastic radiation therapy: Secondary | ICD-10-CM | POA: Diagnosis not present

## 2021-09-09 DIAGNOSIS — Z170 Estrogen receptor positive status [ER+]: Secondary | ICD-10-CM | POA: Diagnosis not present

## 2021-09-09 DIAGNOSIS — F17210 Nicotine dependence, cigarettes, uncomplicated: Secondary | ICD-10-CM | POA: Diagnosis not present

## 2021-09-09 DIAGNOSIS — C50412 Malignant neoplasm of upper-outer quadrant of left female breast: Secondary | ICD-10-CM | POA: Diagnosis not present

## 2021-09-09 LAB — RAD ONC ARIA SESSION SUMMARY
Course Elapsed Days: 35
Plan Fractions Treated to Date: 1
Plan Prescribed Dose Per Fraction: 2 Gy
Plan Total Fractions Prescribed: 5
Plan Total Prescribed Dose: 10 Gy
Reference Point Dosage Given to Date: 52 Gy
Reference Point Session Dosage Given: 2 Gy
Session Number: 26

## 2021-09-10 ENCOUNTER — Ambulatory Visit
Admission: RE | Admit: 2021-09-10 | Discharge: 2021-09-10 | Disposition: A | Discharge status: Home / Self Care | Payer: 59 | Source: Ambulatory Visit | Attending: Radiation Oncology | Admitting: Radiation Oncology

## 2021-09-10 ENCOUNTER — Other Ambulatory Visit: Payer: Self-pay

## 2021-09-10 DIAGNOSIS — F17210 Nicotine dependence, cigarettes, uncomplicated: Secondary | ICD-10-CM | POA: Diagnosis not present

## 2021-09-10 DIAGNOSIS — Z79899 Other long term (current) drug therapy: Secondary | ICD-10-CM | POA: Diagnosis not present

## 2021-09-10 DIAGNOSIS — Z79811 Long term (current) use of aromatase inhibitors: Secondary | ICD-10-CM | POA: Diagnosis not present

## 2021-09-10 DIAGNOSIS — Z170 Estrogen receptor positive status [ER+]: Secondary | ICD-10-CM | POA: Diagnosis not present

## 2021-09-10 DIAGNOSIS — Z510 Encounter for antineoplastic radiation therapy: Secondary | ICD-10-CM | POA: Diagnosis not present

## 2021-09-10 DIAGNOSIS — C50412 Malignant neoplasm of upper-outer quadrant of left female breast: Secondary | ICD-10-CM | POA: Diagnosis not present

## 2021-09-10 LAB — RAD ONC ARIA SESSION SUMMARY
Course Elapsed Days: 36
Plan Fractions Treated to Date: 2
Plan Prescribed Dose Per Fraction: 2 Gy
Plan Total Fractions Prescribed: 5
Plan Total Prescribed Dose: 10 Gy
Reference Point Dosage Given to Date: 54 Gy
Reference Point Session Dosage Given: 2 Gy
Session Number: 27

## 2021-09-11 ENCOUNTER — Ambulatory Visit
Admission: RE | Admit: 2021-09-11 | Discharge: 2021-09-11 | Disposition: A | Discharge status: Home / Self Care | Payer: 59 | Source: Ambulatory Visit | Attending: Radiation Oncology | Admitting: Radiation Oncology

## 2021-09-11 ENCOUNTER — Other Ambulatory Visit: Payer: Self-pay

## 2021-09-11 DIAGNOSIS — F17210 Nicotine dependence, cigarettes, uncomplicated: Secondary | ICD-10-CM | POA: Diagnosis not present

## 2021-09-11 DIAGNOSIS — Z79899 Other long term (current) drug therapy: Secondary | ICD-10-CM | POA: Diagnosis not present

## 2021-09-11 DIAGNOSIS — Z510 Encounter for antineoplastic radiation therapy: Secondary | ICD-10-CM | POA: Diagnosis not present

## 2021-09-11 DIAGNOSIS — Z170 Estrogen receptor positive status [ER+]: Secondary | ICD-10-CM | POA: Diagnosis not present

## 2021-09-11 DIAGNOSIS — C50412 Malignant neoplasm of upper-outer quadrant of left female breast: Secondary | ICD-10-CM | POA: Diagnosis not present

## 2021-09-11 DIAGNOSIS — Z79811 Long term (current) use of aromatase inhibitors: Secondary | ICD-10-CM | POA: Diagnosis not present

## 2021-09-11 LAB — RAD ONC ARIA SESSION SUMMARY
Course Elapsed Days: 37
Plan Fractions Treated to Date: 3
Plan Prescribed Dose Per Fraction: 2 Gy
Plan Total Fractions Prescribed: 5
Plan Total Prescribed Dose: 10 Gy
Reference Point Dosage Given to Date: 56 Gy
Reference Point Session Dosage Given: 2 Gy
Session Number: 28

## 2021-09-12 ENCOUNTER — Other Ambulatory Visit: Payer: Self-pay

## 2021-09-12 ENCOUNTER — Ambulatory Visit
Admission: RE | Admit: 2021-09-12 | Discharge: 2021-09-12 | Disposition: A | Discharge status: Home / Self Care | Payer: 59 | Source: Ambulatory Visit | Attending: Radiation Oncology | Admitting: Radiation Oncology

## 2021-09-12 DIAGNOSIS — Z510 Encounter for antineoplastic radiation therapy: Secondary | ICD-10-CM | POA: Diagnosis not present

## 2021-09-12 DIAGNOSIS — Z170 Estrogen receptor positive status [ER+]: Secondary | ICD-10-CM | POA: Diagnosis not present

## 2021-09-12 DIAGNOSIS — F17210 Nicotine dependence, cigarettes, uncomplicated: Secondary | ICD-10-CM | POA: Diagnosis not present

## 2021-09-12 DIAGNOSIS — C50412 Malignant neoplasm of upper-outer quadrant of left female breast: Secondary | ICD-10-CM | POA: Diagnosis not present

## 2021-09-12 DIAGNOSIS — Z79811 Long term (current) use of aromatase inhibitors: Secondary | ICD-10-CM | POA: Diagnosis not present

## 2021-09-12 DIAGNOSIS — Z79899 Other long term (current) drug therapy: Secondary | ICD-10-CM | POA: Diagnosis not present

## 2021-09-12 LAB — RAD ONC ARIA SESSION SUMMARY
Course Elapsed Days: 38
Plan Fractions Treated to Date: 4
Plan Prescribed Dose Per Fraction: 2 Gy
Plan Total Fractions Prescribed: 5
Plan Total Prescribed Dose: 10 Gy
Reference Point Dosage Given to Date: 58 Gy
Reference Point Session Dosage Given: 2 Gy
Session Number: 29

## 2021-09-15 ENCOUNTER — Other Ambulatory Visit: Payer: Self-pay

## 2021-09-15 ENCOUNTER — Encounter: Payer: Self-pay | Admitting: Hematology

## 2021-09-15 ENCOUNTER — Ambulatory Visit
Admission: RE | Admit: 2021-09-15 | Discharge: 2021-09-15 | Disposition: A | Discharge status: Home / Self Care | Payer: 59 | Source: Ambulatory Visit | Attending: Radiation Oncology | Admitting: Radiation Oncology

## 2021-09-15 ENCOUNTER — Encounter: Payer: Self-pay | Admitting: *Deleted

## 2021-09-15 ENCOUNTER — Inpatient Hospital Stay: Payer: 59 | Attending: Hematology | Admitting: Hematology

## 2021-09-15 ENCOUNTER — Encounter: Payer: Self-pay | Admitting: Radiation Oncology

## 2021-09-15 VITALS — BP 126/76 | HR 68 | Temp 98.4°F | Resp 18 | Ht 61.0 in | Wt 135.2 lb

## 2021-09-15 DIAGNOSIS — Z170 Estrogen receptor positive status [ER+]: Secondary | ICD-10-CM | POA: Diagnosis not present

## 2021-09-15 DIAGNOSIS — E2839 Other primary ovarian failure: Secondary | ICD-10-CM

## 2021-09-15 DIAGNOSIS — C50412 Malignant neoplasm of upper-outer quadrant of left female breast: Secondary | ICD-10-CM

## 2021-09-15 DIAGNOSIS — Z510 Encounter for antineoplastic radiation therapy: Secondary | ICD-10-CM | POA: Diagnosis not present

## 2021-09-15 DIAGNOSIS — Z79811 Long term (current) use of aromatase inhibitors: Secondary | ICD-10-CM | POA: Diagnosis not present

## 2021-09-15 DIAGNOSIS — F17210 Nicotine dependence, cigarettes, uncomplicated: Secondary | ICD-10-CM | POA: Insufficient documentation

## 2021-09-15 DIAGNOSIS — Z79899 Other long term (current) drug therapy: Secondary | ICD-10-CM | POA: Diagnosis not present

## 2021-09-15 LAB — RAD ONC ARIA SESSION SUMMARY
Course Elapsed Days: 41
Plan Fractions Treated to Date: 5
Plan Prescribed Dose Per Fraction: 2 Gy
Plan Total Fractions Prescribed: 5
Plan Total Prescribed Dose: 10 Gy
Reference Point Dosage Given to Date: 60 Gy
Reference Point Session Dosage Given: 2 Gy
Session Number: 30

## 2021-09-15 NOTE — Progress Notes (Signed)
?[place]   ?[phone] [phone]   ?Clinic Follow up Note  ? ?Patient Care Team: ?Almedia Balls, NP as PCP - General (Family Medicine) ?Rockwell Germany, RN as Oncology Nurse Navigator ?Mauro Kaufmann, RN as Oncology Nurse Navigator ?Stark Klein, MD as Consulting Physician (General Surgery) ?Truitt Merle, MD as Consulting Physician (Hematology) ?Gery Pray, MD as Consulting Physician (Radiation Oncology) ? ?Date of Service:  09/15/2021 ? ?CHIEF COMPLAINT: f/u of left breast cancer ? ?CURRENT THERAPY:  ?To start letrozole ? ?ASSESSMENT & PLAN:  ?Brenda Waters is a 56 y.o. post-menopausal female with  ? ?1. 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 Biopsy 04/11/21 revealed invasive and in situ lobular carcinoma. ?-breast MRI on 04/28/21 showed 5.3 cm upper left breast mass, no suspicious lymphadenopathy. ?-she proceeded to left mastectomy on 06/12/21 with Dr. Barry Dienes. 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 ?-lymph node dissection on 07/02/21 was negative (0/14). ?-Oncotype RS of 14, indicating low risk. I reviewed that because of this, she did not require chemotherapy. ?-she is finishing postmastectomy radiation today under Dr. Sondra Come, started 08/05/21. ?-Giving the strong ER and PR expression in her postmenopausal status, I recommend adjuvant endocrine therapy with aromatase inhibitor for a total of 10 years due to her lobular histology to reduce the risk of cancer recurrence. Potential benefits and side effects were discussed with patient and she is interested. I called in letrozole for her to start in about 3-4 weeks; she plans to start the day of her f/u with Dr. Sondra Come on  10/16/21. ?-Adjuvant Verzenio is not indicated ?-We also discussed the breast cancer surveillance after her surgery. She will continue annual screening mammogram, self exam, and a routine office visit with lab and exam with [place]. ?  ?2. Bone Health  ?-She has never had a DEXA. We will obtain one for baseline. ?  ?3. Social Support, lack of insurance  ?-she notes she can't afford the insurance her job offers. She is now covered by [employer]. ?-she continues to smoke and does not seem interested in quitting at this time. I recommended she try to at least cut back. ?  ?  ?PLAN:  ?-start letrozole in 4 weeks ?-bone density anytime ?-survivorship in 3 months ?-lab and f/u in 6 months ? ? ?No problem-specific Assessment & Plan notes found for this encounter. ? ? ?SUMMARY OF ONCOLOGIC HISTORY: ?Oncology History Overview Note  ? Cancer Staging  ?Malignant neoplasm of upper-outer quadrant of left breast in female, estrogen receptor positive ([place]) ?Staging form: Breast, AJCC 8th Edition ?- Clinical stage from 04/11/2021: Stage IB (cT2, cN0, cM0, G2, ER+, PR+, HER2-) - Signed by Truitt Merle, MD on 04/22/2021 ?- Pathologic stage from 06/12/2021: Stage IB (pT2, pN1a, cM0, G2, ER+, PR+, HER2-, Oncotype DX score: 14) - Signed by Truitt Merle, MD on 09/15/2021 ? ? ?  ?Malignant neoplasm of upper-outer quadrant of left breast in female, estrogen receptor positive ([place])  ?03/27/2021 Mammogram  ? EXAM: ?DIGITAL DIAGNOSTIC BILATERAL MAMMOGRAM WITH TOMOSYNTHESIS AND CAD; ?ULTRASOUND LEFT BREAST LIMITED ? ?IMPRESSION: ?Highly suspicious mass in the left breast at 12 o'clock, 4 cm from ?the nipple. Tiny satellite lesion 2 mm from the dominant mass. There is a group of calcifications with associated density lateral and inferior  to the mass. There are calcifications between the density in the dominant mass as well. ?  ?04/11/2021 Cancer Staging  ? Staging form: Breast, AJCC 8th Edition ?- Clinical stage from 04/11/2021: Stage IB (cT2, cN0, cM0, G2, ER+, PR+,  HER2-) - Signed by Truitt Merle, MD on 04/22/2021 ?Stage prefix: Initial diagnosis ?Histologic grading system: 3 grade system ? ?  ?04/11/2021 Initial Biopsy  ? Diagnosis ?1. Breast, left, needle core biopsy, 12 o'clock, 4cmfn ?- INVASIVE MAMMARY CARCINOMA ?- MAMMARY CARCINOMA IN SITU ?- SEE COMMENT ?2. Breast, left, needle core biopsy, upper outer quadrant ?- INVASIVE MAMMARY CARCINOMA ?- SEE COMMENT ?Microscopic Comment ?1. and 2. The biopsy material shows an infiltrative proliferation of cells arranged linearly and in small clusters. Based ?on the biopsy, the carcinoma appears Nottingham grade 2 of 3 and measures 1.5 cm in greatest linear extent. ? ?2. There are microcalcifications associated with benign breast tissue (invasive carcinoma) in the biopsy material. ? ?1. and 2. E-cadherin is NEGATIVE supporting lobular origin. ? ?1. PROGNOSTIC INDICATORS ?Results: ?The tumor cells are EQUIVOCAL for Her2 (2+). Her2 by FISH will be performed and results reported separately. ?Estrogen Receptor: 80%, POSITIVE, STRONG STAINING INTENSITY ?Progesterone Receptor: 70%, POSITIVE, STRONG STAINING INTENSITY ?Proliferation Marker Ki67: 15% ? ?1. FLUORESCENCE IN-SITU HYBRIDIZATION ?Results: ?GROUP 5: HER2 **NEGATIVE** ?  ?04/21/2021 Initial Diagnosis  ? Malignant neoplasm of upper-outer quadrant of left breast in female, estrogen receptor positive ([place]) ? ?  ?04/28/2021 Imaging  ? EXAM: ?BILATERAL BREAST MRI WITH AND WITHOUT CONTRAST ? ?IMPRESSION: ?1. Biopsy-proven malignancy in the upper left breast spanning ?approximately 5.3 x 3.3 x 4.9 cm. Two post biopsy clips are located ?centrally within the mass and are located 2 cm from one another. ?2. No MRI evidence of malignancy elsewhere within the left breast or within the right breast. ?3. No suspicious lymphadenopathy. ?4. 7 mm nonenhancing T2 hyperintense mass along the inferior ?portions of the visualized liver. The appearance is suggestive of a ?hepatic cyst but is not fully  characterized on today's study. ?Recommend further cross-sectional evaluation. ?  ?06/12/2021 Cancer Staging  ? Staging form: Breast, AJCC 8th Edition ?- Pathologic stage from 06/12/2021: Stage IB (pT2, pN1a, cM0, G2, ER+, PR+, HER2-, Oncotype DX score: 14) - Signed by Truitt Merle, MD on 09/15/2021 ?Stage prefix: Initial diagnosis ?Multigene prognostic tests performed: Oncotype DX ?Recurrence score range: Greater than or equal to 11 ?Histologic grading system: 3 grade system ?Residual tumor (R): R0 - None ? ?  ?06/12/2021 Definitive Surgery  ? FINAL MICROSCOPIC DIAGNOSIS:  ? ?A. 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 ?B. LYMPH NODE, LEFT AXILLA #1, SENTINEL, EXCISION:  ?Metastatic lobular carcinoma (1/1)  ?Extranodal extension present  ? ?C. LYMPH NODE, LEFT AXILLA #2, SENTINEL, EXCISION:  ?Metastatic lobular carcinoma (1/1)  ?Extranodal extension present  ? ?D. LYMPH NODE, LEFT AXILLA #3, SENTINEL, EXCISION:  ?Metastatic lobular carcinoma (1/1)  ? ?E. LYMPH NODE, LEFT AXILLA, SENTINEL, EXCISION:  ?One benign lymph node, negative for carcinoma (0/1)  ? ?F. LYMPH NODE, LEFT AXILLA, SENTINEL, EXCISION:  ?One benign lymph node, negative for carcinoma (0/1)  ?  ?07/02/2021 Pathology Results  ? FINAL MICROSCOPIC DIAGNOSIS:  ? ?A. LYMPH NODES, LEFT AXILLARY, DISSECTION:  ?-  No carcinoma identified in fourteen lymph node (0/14)  ?-  Soft tissue with previous surgical site changes including foreign  ?body giant cell reaction  ?-  See comment  ? ?COMMENT:  ?Given the patient's history of pleomorphic lobular carcinoma,  ?cytokeratin AE1/3 is performed on submitted blocks to assess for the presence of isolated tumor cells.  The immunohistochemistry supports the absence of  metastatic disease.  ?  ? ? ? ?INTERVAL HISTORY:  ?AMARYS SLIWINSKI is here for a follow up of breast cancer. She was last seen by me on 04/23/21 in consultation. She presents to the clinic alone. ?She reports radiation has

## 2021-09-25 ENCOUNTER — Ambulatory Visit
Admission: RE | Admit: 2021-09-25 | Discharge: 2021-09-25 | Disposition: A | Discharge status: Home / Self Care | Payer: 59 | Source: Ambulatory Visit | Attending: Hematology | Admitting: Hematology

## 2021-09-25 DIAGNOSIS — E2839 Other primary ovarian failure: Secondary | ICD-10-CM

## 2021-09-25 DIAGNOSIS — I10 Essential (primary) hypertension: Secondary | ICD-10-CM | POA: Diagnosis not present

## 2021-09-25 DIAGNOSIS — R5383 Other fatigue: Secondary | ICD-10-CM | POA: Diagnosis not present

## 2021-09-29 ENCOUNTER — Telehealth: Payer: Self-pay

## 2021-09-29 NOTE — Telephone Encounter (Signed)
This nurse reached out to patient related to her DEXA scan results.  Left a message for patient to return call to the facility.  No further concerns at this time.

## 2021-09-30 ENCOUNTER — Telehealth: Payer: Self-pay

## 2021-09-30 NOTE — Telephone Encounter (Signed)
-----   Message from Truitt Merle, MD sent at 09/28/2021 11:31 AM EDT ----- Please let pt know her DEXA results, she has osteopenia, no high risk for fracture, I recommend OTC calcium and vitD, and weight bearing exercise, thanks   Truitt Merle

## 2021-09-30 NOTE — Telephone Encounter (Signed)
This nurse spoke with patient and made aware of DEXA results and MD recommendations mentioned below.  Patient acknowledged understanding.  No further questions or concerns at this time

## 2021-10-01 ENCOUNTER — Ambulatory Visit (INDEPENDENT_AMBULATORY_CARE_PROVIDER_SITE_OTHER): Payer: 59 | Admitting: Plastic Surgery

## 2021-10-01 DIAGNOSIS — Z170 Estrogen receptor positive status [ER+]: Secondary | ICD-10-CM

## 2021-10-01 DIAGNOSIS — C50412 Malignant neoplasm of upper-outer quadrant of left female breast: Secondary | ICD-10-CM

## 2021-10-01 NOTE — Progress Notes (Signed)
   Referring Provider Almedia Balls, NP [place],  [place] [address]   CC:  Chief Complaint  Patient presents with   Follow-up      Brenda Waters is an 56 y.o. female.  HPI: Patient presents to discuss delayed breast reconstruction on the left.  Since I saw her last she has had a left mastectomy with sentinel lymph node biopsy followed by left axillary dissection for positive lymph nodes.  She subsequently had radiation treatment.  That finished 3 weeks ago.  She would like to discuss her reconstructive options.  She would prefer not to have any foreign tissue utilized.  She has had no back surgeries and reports 2 C-sections through a midline incision as the first 1 was an emergency C-section.  She says that she is currently around a C cup on the right side and would like to remain about that size.  Review of Systems General: Denies fevers and chills  Physical Exam    09/15/2021   12:05 PM 07/28/2021    1:15 PM 07/02/2021    5:32 PM  Vitals with BMI  Height '5\' 1"'$  '5\' 1"'$    Weight 135 lbs 3 oz 132 lbs 13 oz   BMI 49.67 59.16   Systolic 384 665 993  Diastolic 76 81 88  Pulse 68 78 100    General:  No acute distress,  Alert and oriented, Non-Toxic, Normal speech and affect Patient is well-healed from her mastectomy on the left.  She has radiation changes and erythema and skin blistering consistent with her recent radiation treatments.  No scars or masses on the right side.  Probably around a C cup in volume with grade 2 ptosis  Assessment/Plan We discussed a number of reconstructive options.  She would prefer not to have implant-based reconstruction.  I did describe a free tissue transfer and the length of that procedure concerns her and she would rather not pursue that at this time.  I did bring up the possibility of a left-sided latissimus flap followed by secondary fat grafting.  I do think this would give her some volume but did communicate that there would be  limitation in terms of the volume of the reconstruction.  I think lift on the right side would give her a reasonable match.  We did review the risks and benefits of all these procedures.  She is going to continue to consider her options and revisit this with me around 3 months from now.  All of her questions were answered.  Cindra Presume 10/01/2021, 9:36 AM

## 2021-10-07 ENCOUNTER — Encounter: Payer: Self-pay | Admitting: Rehabilitation

## 2021-10-07 ENCOUNTER — Ambulatory Visit: Payer: 59 | Attending: General Surgery | Admitting: Rehabilitation

## 2021-10-07 DIAGNOSIS — C50412 Malignant neoplasm of upper-outer quadrant of left female breast: Secondary | ICD-10-CM | POA: Diagnosis not present

## 2021-10-07 DIAGNOSIS — Z483 Aftercare following surgery for neoplasm: Secondary | ICD-10-CM | POA: Insufficient documentation

## 2021-10-07 DIAGNOSIS — Z170 Estrogen receptor positive status [ER+]: Secondary | ICD-10-CM | POA: Insufficient documentation

## 2021-10-07 NOTE — Therapy (Signed)
OUTPATIENT PHYSICAL THERAPY TREATMENT NOTE   Patient Name: Brenda Waters MRN: [id number] DOB:Mar 18, 1966, 56 y.o., female Today's Date: 10/07/2021  PCP: Almedia Balls, NP REFERRING PROVIDER: Stark Klein, MD  END OF SESSION:   PT End of Session - 10/07/21 1410     Visit Number 6    Number of Visits 10    Date for PT Re-Evaluation 11/04/21    PT Start Time 1300    PT Stop Time 1345    PT Time Calculation (min) 45 min    Activity Tolerance Patient tolerated treatment well    Behavior During Therapy WFL for tasks assessed/performed              Past Medical History:  Diagnosis Date   Breast cancer ([place])    Left   GERD (gastroesophageal reflux disease)    diet controlled   Heart murmur    no current problems   Hypertension    Mitral valve prolapse    Past Surgical History:  Procedure Laterality Date   CESAREAN SECTION     x 2 in 1991 and [place] Left 06/12/2021   Procedure: LEFT MASTECTOMY WITH SENTINEL LYMPH NODE BIOPSY;  Surgeon: Stark Klein, MD;  Location: [facility];  Service: General;  Laterality: Left;   NODE DISSECTION Left 07/02/2021   Procedure: LEFT AXILLARY LYMPH NODE DISSECTION;  Surgeon: Stark Klein, MD;  Location: [facility];  Service: General;  Laterality: Left;   [place] EXTRACTION     Patient Active Problem List   Diagnosis Date Noted   Genetic testing 04/23/2021   Malignant neoplasm of upper-outer quadrant of left breast in female, estrogen receptor positive ([place]) 04/21/2021         Past Medical History:  Diagnosis Date   Breast cancer ([place])    Left   GERD (gastroesophageal reflux disease)    diet controlled   Heart murmur    no current problems   Hypertension    Mitral valve prolapse    Past Surgical History:  Procedure Laterality Date   CESAREAN SECTION     x 2 in 1991 and [place] Left 06/12/2021   Procedure: LEFT MASTECTOMY WITH  SENTINEL LYMPH NODE BIOPSY;  Surgeon: Stark Klein, MD;  Location: [facility];  Service: General;  Laterality: Left;   NODE DISSECTION Left 07/02/2021   Procedure: LEFT AXILLARY LYMPH NODE DISSECTION;  Surgeon: Stark Klein, MD;  Location: [facility];  Service: General;  Laterality: Left;   [place] EXTRACTION     Patient Active Problem List   Diagnosis Date Noted   Genetic testing 04/23/2021   Malignant neoplasm of upper-outer quadrant of left breast in female, estrogen receptor positive ([place]) 04/21/2021    SUBJECTIVE  SUBJECTIVE STATEMENT:   I am doing much better. I feel pretty normal.  Noticed no cords. No swelling in the arm noticed. I am still doing my massage   PERTINENT HISTORY:  S/p left mastectomy on 06/12/21 with Dr. Barry Dienes 3/5 Lymph nodes positive. ALND on 07/02/21 with 14/14 lymph nodes negative. Drain removed 07/18/21. Will be starting radiation: Stage IB (cT2, cN0, cM0) Left Breast UOQ, Invasive pleomorphic lobular carcinoma with LCIS, ER+ / PR+ / Her2-, Grade 2  PAIN:  Are you having pain?   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  LEISURE: walking, stretching, "I usually do push ups" (4/14:pt advised to stop doing wall push ups during radiation )  HAND DOMINANCE : right   PRIOR LEVEL OF FUNCTION: Independent  PATIENT GOALS Can I get back to work    OBJECTIVE  UPPER EXTREMITY AROM/PROM:    (Blank rows = not tested)  A/PROM LEFT  08/04/21 LEFT 08/22/2021 10/07/21 Rt 10/07/21 Lt  Shoulder extension 62     Shoulder flexion 145 138 150 130 feeling tight - 145 post treatment  Shoulder abduction 145 - chest tightness 140 chest tightness 170 130 feeling tight  Shoulder  internal rotation      Shoulder external rotation 90       (Blank rows = not tested)   LANDMARK LEFT  08/04/21 10/07/21 Left 10/07/21 Right  10 cm proximal to olecranon process 29.5 29   Olecranon process 24.5 24.5   10 cm proximal to ulnar styloid process 22 20.5   Just proximal to ulnar styloid process 15.5 15.6   Across hand at thumb web space 18 18.1   At base of 2nd digit 5.8 5.7      TODAY'S TREATMENT  10/07/21 Rechecked status including AROM, cirumferences, PROM, and skin assessment. 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  Held on MLD and treatment today.  Pt did bring new sleeve and gauntlet which seem to fit very well.  Pt will wear as needed and was educated on wear, donning, washing, and use with activity   08/26/21 Submitted order to [facility] via email per 08/10/21 note.   Discussed POC as pt is starting to get very red and irritated.   Discussed limiting stretches for now to allow for optimal wound healing going into boost In supine: Short neck, 5 diaphragmatic breaths, bil axillary nodes and establishment of interaxillary pathway, L inguinal nodes and establishment of axilloinguinal pathway, then L chest and UE working proximal to distal, moving fluid from upper inner arm outwards, and doing both sides of forearm moving fluid towards pathways spending extra time in any area of edema then retracing all steps. Avoiding radiation site as it starts to get red.   Gave pt script and info on bras  PATIENT  EDUCATION:  Education details: HEP, cording, how to order compression Person educated: Patient Education method: Explanation, Demonstration, Tactile cues, Verbal cues, and Handouts  Education comprehension: verbalized understanding, returned demonstration, and verbal cues required   HOME EXERCISE PROGRAM: Post op sheet, self release of cording in axilla Access Code: [email or web address] URL: [email or web address] Date: 10/07/2021 Prepared by: Shan Levans  Exercises - Supine Lower Trunk Rotation - 1 x daily - 7 x weekly - 1 sets - 6 reps - 6 second hold - Hooklying Rib Cage Breathing - 1-2 x daily - 7 x weekly - Seated Lateral Trunk Stretch on Swiss Ball - 1 x daily - 7 x weekly - 1 sets - 6 reps - 5 second hold - Doorway Pec Stretch at 90 Degrees Abduction - 1 x daily - 7 x weekly - 1 sets - 3 reps - 20-30 seconds hold  ASSESSMENT:  CLINICAL IMPRESSION: Pt returns 3 weeks post radiation with decreased AROM, healing skin from blistering with some redness still present.  Pt has been holding on most stretching due to pain after boost.  Pt is now feeling much better and ready to get back to stretches.  Pt demonstrated improved AROM even with 1 session of MT and TE.  Reviewed how to watch for lymphedema as pt has had an ALND.  Will extend 1x per week x 4 weeks to maximize AROM.    OBJECTIVE IMPAIRMENTS decreased activity tolerance, decreased knowledge of use of DME, decreased ROM, and decreased strength.   ACTIVITY LIMITATIONS cleaning, occupation, and yard work.   PERSONAL FACTORS 1-2 comorbidities: ALND, mastectomy   are also affecting patient's functional outcome.    REHAB POTENTIAL: Excellent  CLINICAL DECISION MAKING: Stable/uncomplicated  EVALUATION COMPLEXITY: Low  GOALS: Goals reviewed with patient? Yes  SHORT TERM GOALS:  Pt will be education on lymphedema risk reduction, surveillance, and how to use compression for work` Baseline:  Target date: 10/21/2021 Goal status:  MET   LONG TERM GOALS:  Pt will improve Lt shoulder AROM to within 5 deg of the Rt arm to demonstrate improved mobility and reach Baseline:  Target date: 11/04/21 Goal status: ongoing   2.  Pt will be ind with final stretches and strengthening to get ready for return to work Baseline:  Target date: 11/04/21 Goal status: ongoing   PLAN: PT FREQUENCY: 1x/week  PT DURATION: 4 weeks  PLANNED INTERVENTIONS: Therapeutic exercises, Therapeutic activity, Neuromuscular re-education, Patient/Family education, and Manual therapy  PLAN FOR NEXT SESSION: remeasure AROM, AAROM activities, review stretches, STM/PROM, Perform MLD to decrease congestion from radiation.  [address] PT  Stark Bray, PT 10/07/2021, 2:12 PM

## 2021-10-09 ENCOUNTER — Encounter: Payer: Self-pay | Admitting: Rehabilitation

## 2021-10-14 ENCOUNTER — Encounter: Payer: Self-pay | Admitting: Radiation Oncology

## 2021-10-14 ENCOUNTER — Ambulatory Visit: Payer: 59 | Attending: General Surgery | Admitting: Rehabilitation

## 2021-10-14 DIAGNOSIS — Z170 Estrogen receptor positive status [ER+]: Secondary | ICD-10-CM | POA: Insufficient documentation

## 2021-10-14 DIAGNOSIS — Z483 Aftercare following surgery for neoplasm: Secondary | ICD-10-CM | POA: Insufficient documentation

## 2021-10-14 DIAGNOSIS — C50412 Malignant neoplasm of upper-outer quadrant of left female breast: Secondary | ICD-10-CM | POA: Insufficient documentation

## 2021-10-14 DIAGNOSIS — M25612 Stiffness of left shoulder, not elsewhere classified: Secondary | ICD-10-CM | POA: Insufficient documentation

## 2021-10-14 NOTE — Therapy (Signed)
OUTPATIENT PHYSICAL THERAPY TREATMENT NOTE   Patient Name: Brenda Waters MRN: [id number] DOB:1965/07/28, 56 y.o., female Today's Date: 10/14/2021  PCP: Almedia Balls, NP REFERRING PROVIDER: Stark Klein, MD  END OF SESSION:   PT End of Session - 10/14/21 1252     Visit Number 7    Number of Visits 10    Date for PT Re-Evaluation 11/04/21    PT Start Time 1300    PT Stop Time 1342    PT Time Calculation (min) 42 min    Activity Tolerance Patient tolerated treatment well    Behavior During Therapy WFL for tasks assessed/performed              Past Medical History:  Diagnosis Date   Breast cancer ([place])    Left   GERD (gastroesophageal reflux disease)    diet controlled   Heart murmur    no current problems   History of radiation therapy    Left chest wall- 08/05/21-09/15/21- Dr. Gery Pray   Hypertension    Mitral valve prolapse    Past Surgical History:  Procedure Laterality Date   CESAREAN SECTION     x 2 in 1991 and [place] Left 06/12/2021   Procedure: LEFT MASTECTOMY WITH SENTINEL LYMPH NODE BIOPSY;  Surgeon: Stark Klein, MD;  Location: [place];  Service: General;  Laterality: Left;   NODE DISSECTION Left 07/02/2021   Procedure: LEFT AXILLARY LYMPH NODE DISSECTION;  Surgeon: Stark Klein, MD;  Location: [place];  Service: General;  Laterality: Left;   [place] EXTRACTION     Patient Active Problem List   Diagnosis Date Noted   Genetic testing 04/23/2021   Malignant neoplasm of upper-outer quadrant of left breast in female, estrogen receptor positive ([place]) 04/21/2021         Past Medical History:  Diagnosis Date   Breast cancer ([place])    Left   GERD (gastroesophageal reflux disease)    diet controlled   Heart murmur    no current problems   History of radiation therapy    Left chest wall- 08/05/21-09/15/21- Dr. Gery Pray   Hypertension    Mitral valve prolapse    Past Surgical  History:  Procedure Laterality Date   CESAREAN SECTION     x 2 in 1991 and [place] Left 06/12/2021   Procedure: LEFT MASTECTOMY WITH SENTINEL LYMPH NODE BIOPSY;  Surgeon: Stark Klein, MD;  Location: [place];  Service: General;  Laterality: Left;   NODE DISSECTION Left 07/02/2021   Procedure: LEFT AXILLARY LYMPH NODE DISSECTION;  Surgeon: Stark Klein, MD;  Location: [place];  Service: General;  Laterality: Left;   [place] EXTRACTION     Patient Active Problem List   Diagnosis Date Noted   Genetic testing 04/23/2021   Malignant neoplasm of upper-outer quadrant of left breast in female, estrogen receptor positive ([place]) 04/21/2021    SUBJECTIVE  SUBJECTIVE STATEMENT:    I am a whole lot better  PERTINENT HISTORY:  S/p left mastectomy on 06/12/21 with Dr. Barry Dienes 3/5 Lymph nodes positive. ALND on 07/02/21 with 14/14 lymph nodes negative. Drain removed 07/18/21. Will be starting radiation: Stage IB (cT2, cN0, cM0) Left Breast UOQ, Invasive pleomorphic lobular carcinoma with LCIS, ER+ / PR+ / Her2-, Grade 2  PAIN:  Are you having pain? 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  LEISURE: walking, stretching, "I usually do push ups" (4/14:pt advised to stop doing wall push ups during radiation )  HAND DOMINANCE : right   PRIOR LEVEL OF FUNCTION: Independent  PATIENT GOALS Can I get back to work    OBJECTIVE  UPPER EXTREMITY AROM/PROM:    (Blank rows = not tested)  A/PROM LEFT  08/04/21 LEFT 08/22/2021 10/07/21 Rt 10/07/21 Lt 10/14/21 lt  Shoulder extension 62      Shoulder flexion 145 138 150 130 feeling tight - 145 post treatment 145-chest  tightness  Shoulder abduction 145 - chest tightness 140 chest tightness 170 130 feeling tight 145 - chest tightness  Shoulder internal rotation       Shoulder external rotation 90        (Blank rows = not tested)   LANDMARK LEFT  08/04/21 10/07/21 Left 10/07/21 Right  10 cm proximal to olecranon process 29.5 29   Olecranon process 24.5 24.5   10 cm proximal to ulnar styloid process 22 20.5   Just proximal to ulnar styloid process 15.5 15.6   Across hand at thumb web space 18 18.1   At base of 2nd digit 5.8 5.7      TODAY'S TREATMENT  10/14/21 Rechecked AROM Pulleys flexion x 62mn with initial instruction and cueing and abduction x260m Wall ball flexion x 5 and abduction x 5 PROM into flexion, abduction, and D2 flexion STM left pectoralis and latissimus avoiding any irritated places   10/07/21 Rechecked status including AROM, cirumferences, PROM, and skin assessment. 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  Held on MLD and treatment today.  Pt did bring new sleeve and gauntlet which seem to fit very well.  Pt will wear as needed and was educated on wear, donning, washing, and use with activity   Gave pt script and info on bras  PATIENT EDUCATION:  Education details: HEP, cording, how to order compression Person educated: Patient Education method: Explanation, Demonstration, Tactile cues, Verbal cues, and Handouts Education comprehension: verbalized understanding, returned demonstration, and verbal cues required   HOME  EXERCISE PROGRAM: Post op sheet, self release of cording in axilla Access Code: [email or web address]: [email or web address] Date: 10/07/2021 Prepared by: KarShan Levansxercises - Supine Lower Trunk Rotation - 1 x daily - 7 x weekly - 1 sets - 6 reps - 6 second hold - Hooklying Rib Cage Breathing - 1-2 x daily - 7 x weekly - Seated Lateral Trunk Stretch on Swiss  Ball - 1 x daily - 7 x weekly - 1 sets - 6 reps - 5 second hold - Doorway Pec Stretch at 90 Degrees Abduction - 1 x daily - 7 x weekly - 1 sets - 3 reps - 20-30 seconds hold  ASSESSMENT:  CLINICAL IMPRESSION:   Pt is improving AROM but still tightness into abduction mainly.  Will continue POC>    OBJECTIVE IMPAIRMENTS decreased activity tolerance, decreased knowledge of use of DME, decreased ROM, and decreased strength.   ACTIVITY LIMITATIONS cleaning, occupation, and yard work.  AL FACTORS 1-2 comorbidities: ALND, mastectomy   are also affecting patient's functional outcome.    REHAB POTENTIAL: Excellent  CLINICAL DECISION MAKING: Stable/uncomplicated  EVALUATION COMPLEXITY: Low  GOALS: Goals reviewed with patient? Yes  SHORT TERM GOALS:  Pt will be education on lymphedema risk reduction, surveillance, and how to use compression for work` Baseline:  Target date: 10/21/2021 Goal status: MET   LONG TERM GOALS:  Pt will improve Lt shoulder AROM to within 5 deg of the Rt arm to demonstrate improved mobility and reach Baseline:  Target date: 11/04/21 Goal status: ongoing   2.  Pt will be ind with final stretches and strengthening to get ready for return to work Baseline:  Target date: 11/04/21 Goal status: ongoing   PLAN: PT FREQUENCY: 1x/week  PT DURATION: 4 weeks  PLANNED INTERVENTIONS: Therapeutic exercises, Therapeutic activity, Neuromuscular re-education, Patient/Family education, and Manual therapy  PLAN FOR NEXT SESSION: AAROM activities, review stretches, STM/PROM, Perform MLD to decrease  congestion from radiation.  Shan Levans, PT  Stark Bray, PT 10/14/2021, 1:44 PM

## 2021-10-14 NOTE — Progress Notes (Incomplete)
  Radiation Oncology         (336) [phone] ________________________________  Patient Name: Brenda Waters MRN: [id number] DOB: 1965-07-16 Referring Physician: Truitt Merle (Profile Not Attached) Date of Service: 09/15/2021 [facility], [place]                                                        End Of Treatment Note  Diagnoses: C50.412-Malignant neoplasm of upper-outer quadrant of left female breast Z17.0-Estrogen receptor positive status [ER+]  Cancer Staging: S/p left mastectomy: Stage IB (cT2, cN0, cM0) Left Breast UOQ, Invasive pleomorphic lobular carcinoma with LCIS, ER+ / PR+ / Her2-, Grade 2  Intent: Curative  Radiation Treatment Dates: 08/05/2021 through 09/15/2021 Site Technique Total Dose (Gy) Dose per Fx (Gy) Completed Fx Beam Energies  Chest Wall, Left: CW_L 3D 50/50 2 25/25 6XFFF  Sclav-LT: SCV_L 3D 50/50 2 25/25 6X, 10X  Chest Wall, Left: CW_L_Bst Electron 10/10 2 5/5 6E   Narrative: The patient tolerated radiation therapy relatively well. On the date of her final treatment, the patient reported fatigue, skin burning, skin itching, and redness. Physical exam performed on that same date revealed erythema and hyperpigmentation changes to the left chest wall area.  Very early moist desquamation was also appreciated in one area, for which she given a triple antibiotic ointment to use. Overall, her skin changes were expected and she tolerated treatment quite well.   Plan: The patient will follow-up with radiation oncology in one month .  ________________________________________________ -----------------------------------  Blair Promise, PhD, MD  This document serves as a record of services personally performed by Gery Pray, MD. It was created on his behalf by Roney Mans, a trained medical scribe. The creation of this record is based on the scribe's personal observations and the provider's statements to them. This document has been checked and approved by  the attending provider.

## 2021-10-15 NOTE — Progress Notes (Incomplete)
Brenda Waters is here today for follow up post radiation to the breast.   Breast Side:Left chest wall   They completed their radiation on: 09/15/21  Does the patient complain of any of the following: Post radiation skin issues: *** Breast Tenderness: *** Breast Swelling: *** Lymphadema: *** Range of Motion limitations: *** Fatigue post radiation: *** Appetite good/fair/poor: ***  Additional comments if applicable:

## 2021-10-15 NOTE — Progress Notes (Signed)
Radiation Oncology         (336) [phone] ________________________________  Name: Brenda Waters MRN: [id number]  Date: 10/16/2021  DOB: [birth year]  Follow-Up Visit Note  CC: Almedia Balls, NP  Truitt Merle, MD    ICD-10-CM   1. Malignant neoplasm of upper-outer quadrant of left breast in female, estrogen receptor positive ([place])  C50.412    Z17.0       Diagnosis:  S/p left mastectomy: Stage IB (cT2, cN0, cM0) Left Breast UOQ, Invasive pleomorphic lobular carcinoma with LCIS, ER+ / PR+ / Her2-, Grade 2  Interval Since Last Radiation: 1 month   Intent: Curative  Radiation Treatment Dates: 08/05/2021 through 09/15/2021 Site Technique Total Dose (Gy) Dose per Fx (Gy) Completed Fx Beam Energies  Chest Wall, Left: CW_L 3D 50/50 2 25/25 6XFFF  Sclav-LT: SCV_L 3D 50/50 2 25/25 6X, 10X  Chest Wall, Left: CW_L_Bst Electron 10/10 2 5/5 6E    Narrative:  The patient returns today for routine follow-up.  The patient tolerated radiation therapy relatively well. On the date of her final treatment, the patient reported fatigue, skin burning, skin itching, and redness. Physical exam performed on that same date revealed erythema and hyperpigmentation changes to the left chest wall area.  Very early moist desquamation was also appreciated in one area, for which she was given a triple antibiotic ointment to use. Overall, her skin changes were expected and she tolerated treatment quite well.      On the date of final treatment the patient also followed up with Dr. Burr Medico.  Giving the strong ER and PR expression in her postmenopausal status, Dr. Burr Medico recommended adjuvant endocrine therapy with aromatase inhibitor for a total of 10 years due to her lobular histology to reduce the risk of cancer recurrence. She plans to start letrozole today (10/16/21).          The patient also recently met with Dr. Silverio Lay, plastic surgery, on 10/01/21 to discuss reconstructive options. Options discussed with the patient included  fat grafting and a lift given that she is hesitant to have implants. The patient opted to consider her options further before making a final decision. She will return to Dr. Silverio Lay in 3 months for further discussion.    Imaging performed in the interval includes a bone density study performed on 09/25/21 which revealed a T-score of -1.8. This classifies the patient as osteopenic/low bone mass according to [facility] criteria.         Of note: the patient also started meeting with OP rehab on 10/07/21.   She denies any pain along the left chest wall area.  She reports ongoing numbness.  She continues to undergo physical therapy once a week which she feels has been helpful.  She is scheduled to start letrozole later this week.  She denies any problems with swelling in her left arm or hand.  She does report ongoing fatigue and reports her energy level was 50% of normal.            Allergies:  has No Known Allergies.  Meds: Current Outpatient Medications  Medication Sig Dispense Refill   acetaminophen (TYLENOL) 325 MG tablet Take 650 mg by mouth every 6 (six) hours as needed for moderate pain.     Apple Cider Vinegar 500 MG TABS Take 500 mg by mouth in the morning and at bedtime.     ciclopirox (PENLAC) 8 % solution Apply 1 drop topically at bedtime.     fluticasone (FLONASE) 50 MCG/ACT nasal  spray Place 2 sprays into both nostrils at bedtime.     ibuprofen (ADVIL) 600 MG tablet Take 1 tablet (600 mg total) by mouth every 8 (eight) hours as needed for mild pain or moderate pain. 30 tablet 1   losartan-hydrochlorothiazide (HYZAAR) 50-12.5 MG tablet Take 1 tablet by mouth daily.     Multiple Vitamin (MULTIVITAMIN WITH MINERALS) TABS tablet Take 1 tablet by mouth daily.     gabapentin (NEURONTIN) 100 MG capsule Take 2 capsules (200 mg total) by mouth 2 (two) times daily. (Patient not taking: Reported on 10/16/2021) 40 capsule 1   methocarbamol (ROBAXIN) 500 MG tablet Take 1 tablet (500 mg total) by mouth every  6 (six) hours as needed for muscle spasms. (Patient not taking: Reported on 10/16/2021) 20 tablet 1   oxyCODONE (OXY IR/ROXICODONE) 5 MG immediate release tablet Take 1 tablet (5 mg total) by mouth every 6 (six) hours as needed for severe pain or breakthrough pain. (Patient not taking: Reported on 10/16/2021) 5 tablet 0   No current facility-administered medications for this encounter.    Physical Findings: The patient is in no acute distress. Patient is alert and oriented.  weight is 135 lb 9.6 oz (61.5 kg). Her temperature is 97.3 F (36.3 C) (abnormal). Her blood pressure is 125/82 and her pulse is 72. Her respiration is 20 and oxygen saturation is 98%. .  No significant changes. Lungs are clear to auscultation bilaterally. Heart has regular rate and rhythm. No palpable cervical, supraclavicular, or axillary adenopathy. Abdomen soft, non-tender, normal bowel sounds.  The right breast area shows no palpable mass nipple discharge or bleeding.  The left chest wall area shows a mastectomy scar which is healed well.  The patient continues to have significant hyperpigmentation changes throughout the left chest wall region.  No palpable or visible signs of recurrence.  Some edema to the soft tissues of the lateral chest wall region.  She appears to have good range of movement in her arm and shoulder.   Lab Findings: Lab Results  Component Value Date   WBC 13.9 (H) 06/13/2021   HGB 10.2 (L) 06/13/2021   HCT 29.1 (L) 06/13/2021   MCV 89.5 06/13/2021   PLT 276 06/13/2021    Radiographic Findings: DG Bone Density  Result Date: 09/25/2021 EXAM: DUAL X-RAY ABSORPTIOMETRY (DXA) FOR BONE MINERAL DENSITY IMPRESSION: Referring Physician:  Truitt Merle Your patient completed a bone mineral density test using GE Lunar iDXA system (analysis version: 16). Technologist: [place] PATIENT: Name: Brenda Waters Patient ID: [id number] Birth Date: 04/20/1966 Height: 62.0 in. Sex: Female Measured: 09/25/2021 Weight: 134.0 lbs.  Indications: Breast Cancer History, Caucasian, Estrogen Deficient, Postmenopausal, Tobacco User (Current Smoker) Fractures: NONE Treatments: None ASSESSMENT: The BMD measured at Femur Neck Right is 0.785 g/cm2 with a T-score of -1.8. This patient is considered osteopenic/low bone mass according to [facility]) criteria. The quality of the exam is good. Site Region Measured Date Measured Age YA BMD Significant CHANGE T-score DualFemur Neck Right 09/25/2021 57.9 -1.8 0.785 g/cm2 AP Spine L1-L4 09/25/2021 57.9 -0.9 1.074 g/cm2 DualFemur Total Mean 09/25/2021 57.9 -1.2 0.860 g/cm2 Left Forearm Radius 33% 09/25/2021 57.9 -0.7 0.814 g/cm2 World Health Organization [facility]) criteria for post-menopausal, Caucasian Women: Normal       T-score at or above -1 SD Osteopenia   T-score between -1 and -2.5 SD Osteoporosis T-score at or below -2.5 SD RECOMMENDATION: 1. All patients should optimize calcium and vitamin D intake. 2. Consider FDA-approved medical therapies in  postmenopausal women and men aged 36 years and older, based on the following: a. A hip or vertebral (clinical or morphometric) fracture. b. T-score = -2.5 at the femoral neck or spine after appropriate evaluation to exclude secondary causes. c. Low bone mass (T-score between -1.0 and -2.5 at the femoral neck or spine) and a 10-year probability of a hip fracture = 3% or a 10-year probability of a major osteoporosis-related fracture = 20% based on the [place]-adapted WHO algorithm. d. Clinician judgment and/or patient preferences may indicate treatment for people with 10-year fracture probabilities above or below these levels. FOLLOW-UP: Patients with diagnosis of osteoporosis or at high risk for fracture should have regular bone mineral density tests.? Patients eligible for Medicare are allowed routine testing every 2 years.? The testing frequency can be increased to one year for patients who have rapidly progressing disease, are receiving or  discontinuing medical therapy to restore bone mass, or have additional risk factors. I have reviewed this study and agree with the findings. [facility] FRAX* 10-year Probability of Fracture Based on femoral neck BMD: DualFemur (Right) Major Osteoporotic Fracture: 8.6% Hip Fracture:                1.6% Population:                  [place] (Caucasian) Risk Factors:                Tobacco User (Current Smoker) *FRAX is a Materials engineer of the [employer] for Metabolic Bone Disease, a World Pharmacologist (WHO) [facility]. ASSESSMENT: The probability of a major osteoporotic fracture is 8.6 % within the next ten years. The probability of a hip fracture is 1.6 % within the next ten years. Electronically Signed   By: Zerita Boers M.D.   On: 09/25/2021 10:42    Impression:  S/p left mastectomy: Stage IB (cT2, cN0, cM0) Left Breast UOQ, Invasive pleomorphic lobular carcinoma with LCIS, ER+ / PR+ / Her2-, Grade 2  The patient's skin is healed well at this time but with significant hyperpigmentation changes which should improve over the next several months.  She has residual fatigue which hopefully will improve over the next several months.  Plan: She would like to follow-up 1 more time in radiation oncology and we will schedule her for follow-up in February 2024.  She will meet with Dr. Burr Medico and Dr. Barry Dienes in the fall.    ____________________________________  Blair Promise, PhD, MD  This document serves as a record of services personally performed by Gery Pray, MD. It was created on his behalf by Roney Mans, a trained medical scribe. The creation of this record is based on the scribe's personal observations and the provider's statements to them. This document has been checked and approved by the attending provider.

## 2021-10-16 ENCOUNTER — Encounter: Payer: Self-pay | Admitting: Rehabilitation

## 2021-10-16 ENCOUNTER — Ambulatory Visit
Admission: RE | Admit: 2021-10-16 | Discharge: 2021-10-16 | Disposition: A | Discharge status: Home / Self Care | Payer: 59 | Source: Ambulatory Visit | Attending: Radiation Oncology | Admitting: Radiation Oncology

## 2021-10-16 ENCOUNTER — Other Ambulatory Visit: Payer: Self-pay

## 2021-10-16 ENCOUNTER — Encounter: Payer: Self-pay | Admitting: Radiation Oncology

## 2021-10-16 VITALS — BP 125/82 | HR 72 | Temp 97.3°F | Resp 20 | Wt 135.6 lb

## 2021-10-16 DIAGNOSIS — C50412 Malignant neoplasm of upper-outer quadrant of left female breast: Secondary | ICD-10-CM | POA: Insufficient documentation

## 2021-10-16 DIAGNOSIS — Z170 Estrogen receptor positive status [ER+]: Secondary | ICD-10-CM | POA: Insufficient documentation

## 2021-10-16 HISTORY — DX: Personal history of irradiation: Z92.3

## 2021-10-16 NOTE — Progress Notes (Signed)
Brenda Waters is here today for follow up post radiation to the breast.   Breast Side:Left chest wall   They completed their radiation on: 09/15/21  Does the patient complain of any of the following: Post radiation skin issues: none Breast Tenderness: no Breast Swelling: no Lymphadema: no Range of Motion limitations: none Fatigue post radiation: moderate Appetite good/fair/poor: good  Additional comments if applicable:  Vitals:   15/52/08 1145  BP: 125/82  Pulse: 72  Resp: 20  Temp: (!) 97.3 F (36.3 C)  SpO2: 98%  Weight: 61.5 kg

## 2021-10-17 ENCOUNTER — Telehealth: Payer: Self-pay

## 2021-10-17 NOTE — Telephone Encounter (Signed)
This nurse returned call to this patient who left a message stating that she needed a prescription for birth control called in.  Stated she checked with her pharmacy and they still have not received a prescription.  This nurse left a message stating after review of the providers plan notes there is no documentation that she was going to order birth control.  Recommended that patient contact primary care or Gynecology so that they can manage that prescription for her.  No further concerns at this time.

## 2021-10-20 ENCOUNTER — Other Ambulatory Visit: Payer: Self-pay

## 2021-10-20 ENCOUNTER — Other Ambulatory Visit: Payer: Self-pay | Admitting: Nurse Practitioner

## 2021-10-20 ENCOUNTER — Telehealth: Payer: Self-pay

## 2021-10-20 DIAGNOSIS — Z170 Estrogen receptor positive status [ER+]: Secondary | ICD-10-CM

## 2021-10-20 MED ORDER — LETROZOLE 2.5 MG PO TABS: 2.5000 mg | ORAL_TABLET | Freq: Every day | ORAL | 3 refills | Status: DC

## 2021-10-20 NOTE — Telephone Encounter (Signed)
Pt called stating that Dr. Burr Medico wanted her to start hormonal therapy on 10/16/2021 but she doesn't have a prescription for the medication.  Reviewed Dr. Ernestina Penna last office note and Dr. Burr Medico wants the pt to start on Letrozole 2.'5mg'$  w/in 4wks.  Dr. Burr Medico unfortunately did not place order.  Spoke with Cira Rue, NP regarding Letrozole prescription.  Lacie placed prescription for Letrozole.  Contacted pt to tell her the prescription has been sent to [place] and they will contact the pt when the prescription is available for pickup.

## 2021-10-21 ENCOUNTER — Ambulatory Visit: Payer: 59 | Admitting: Rehabilitation

## 2021-10-21 ENCOUNTER — Encounter: Payer: Self-pay | Admitting: Rehabilitation

## 2021-10-21 DIAGNOSIS — Z483 Aftercare following surgery for neoplasm: Secondary | ICD-10-CM

## 2021-10-21 DIAGNOSIS — C50412 Malignant neoplasm of upper-outer quadrant of left female breast: Secondary | ICD-10-CM | POA: Diagnosis not present

## 2021-10-21 DIAGNOSIS — M25612 Stiffness of left shoulder, not elsewhere classified: Secondary | ICD-10-CM | POA: Diagnosis not present

## 2021-10-21 DIAGNOSIS — Z170 Estrogen receptor positive status [ER+]: Secondary | ICD-10-CM | POA: Diagnosis not present

## 2021-10-21 NOTE — Therapy (Addendum)
 OUTPATIENT PHYSICAL THERAPY TREATMENT NOTE   Patient Name: Brenda Waters MRN: [id number] DOB:1966/03/07, 56 y.o., female Today's Date: 10/22/2021  PCP: Carilyn Goodpasture, NP REFERRING PROVIDER: Almond Lint, MD  END OF SESSION:   PT End of Session - 10/21/21 1259     Visit Number 8    Number of Visits 10    Date for PT Re-Evaluation 11/04/21    PT Start Time 1302    PT Stop Time 1345    PT Time Calculation (min) 43 min    Activity Tolerance Patient tolerated treatment well    Behavior During Therapy WFL for tasks assessed/performed              Past Medical History:  Diagnosis Date   Breast cancer (HCC)    Left   GERD (gastroesophageal reflux disease)    diet controlled   Heart murmur    no current problems   History of radiation therapy    Left chest wall- 08/05/21-09/15/21- Dr. Antony Blackbird   Hypertension    Mitral valve prolapse    Past Surgical History:  Procedure Laterality Date   CESAREAN SECTION     x 2 in 1991 and 1993   MASTECTOMY W/ SENTINEL NODE BIOPSY Left 06/12/2021   Procedure: LEFT MASTECTOMY WITH SENTINEL LYMPH NODE BIOPSY;  Surgeon: Almond Lint, MD;  Location: [facility] OR;  Service: General;  Laterality: Left;   NODE DISSECTION Left 07/02/2021   Procedure: LEFT AXILLARY LYMPH NODE DISSECTION;  Surgeon: Almond Lint, MD;  Location: [facility] OR;  Service: General;  Laterality: Left;   TUBAL LIGATION  1993   WISDOM TOOTH EXTRACTION     Patient Active Problem List   Diagnosis Date Noted   Genetic testing 04/23/2021   Malignant neoplasm of upper-outer quadrant of left breast in female, estrogen receptor positive (HCC) 04/21/2021         Past Medical History:  Diagnosis Date   Breast cancer (HCC)    Left   GERD (gastroesophageal reflux disease)    diet controlled   Heart murmur    no current problems   History of radiation therapy    Left chest wall- 08/05/21-09/15/21- Dr. Antony Blackbird   Hypertension    Mitral valve prolapse    Past  Surgical History:  Procedure Laterality Date   CESAREAN SECTION     x 2 in 1991 and 1993   MASTECTOMY W/ SENTINEL NODE BIOPSY Left 06/12/2021   Procedure: LEFT MASTECTOMY WITH SENTINEL LYMPH NODE BIOPSY;  Surgeon: Almond Lint, MD;  Location: [facility] OR;  Service: General;  Laterality: Left;   NODE DISSECTION Left 07/02/2021   Procedure: LEFT AXILLARY LYMPH NODE DISSECTION;  Surgeon: Almond Lint, MD;  Location: [facility];  Service: General;  Laterality: Left;   TUBAL LIGATION  1993   WISDOM TOOTH EXTRACTION     Patient Active Problem List   Diagnosis Date Noted   Genetic testing 04/23/2021   Malignant neoplasm of upper-outer quadrant of left breast in female, estrogen receptor positive (HCC) 04/21/2021    SUBJECTIVE  SUBJECTIVE STATEMENT:   Nothing new   PERTINENT HISTORY:  S/p left mastectomy on 06/12/21 with Dr. Donell Beers 3/5 Lymph nodes positive. ALND on 07/02/21 with 14/14 lymph nodes negative. Drain removed 07/18/21. Will be starting radiation: Stage IB (cT2, cN0, cM0) Left Breast UOQ, Invasive pleomorphic lobular carcinoma with LCIS, ER+ / PR+ / Her2-, Grade 2  PAIN:  Are you having pain? 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  LEISURE: walking, stretching, "I usually do push ups" (4/14:pt advised to stop doing wall push ups during radiation )  HAND DOMINANCE : right   PRIOR LEVEL OF FUNCTION: Independent  PATIENT GOALS Can I get back to work    OBJECTIVE  UPPER EXTREMITY AROM/PROM:    (Blank rows = not tested)  A/PROM LEFT  08/04/21 LEFT 08/22/2021 10/07/21 Rt 10/07/21 Lt 10/14/21 lt 10/21/21  Shoulder extension 62       Shoulder flexion 145 138 150 130 feeling tight - 145 post treatment 145-chest  tightness 150  Shoulder abduction 145 - chest tightness 140 chest tightness 170 130 feeling tight 145 - chest tightness 145  Shoulder internal rotation        Shoulder external rotation 90         (Blank rows = not tested)   LANDMARK LEFT  08/04/21 10/07/21 Left 10/07/21 Right  10 cm proximal to olecranon process 29.5 29   Olecranon process 24.5 24.5   10 cm proximal to ulnar styloid process 22 20.5   Just proximal to ulnar styloid process 15.5 15.6   Across hand at thumb web space 18 18.1   At base of 2nd digit 5.8 5.7      TODAY'S TREATMENT  10/22/21 Rechecked AROM Pulleys flexion x with initial instruction and cueing and abduction x71min Wall ball flexion x 5 and abduction x 5 PROM into flexion, abduction, and D2 flexion STM left pectoralis and latissimus avoiding any irritated places   10/14/21 Rechecked AROM Pulleys flexion x with initial instruction and cueing and abduction x63min Yellow band row and extension x 10 each  Supine horizontal abduction x 10 yellow, bil ER x 10 yellow, diagonals x 10 bil PROM into flexion, abduction, and D2 flexion STM left pectoralis and latissimus avoiding any irritated places   10/07/21 Rechecked status including AROM, cirumferences, PROM, and skin assessment. Updated HEP to include per HEP section below with performance of the following:  Supine goalpost arm LTR 6" x 4  Seated mermaid active movement stretch  Single arm doorway 15" x 1  Diaphragmatic breathing x with vcs and tcs for correct movement moderate vcs needed to obtain correct form PROM into flexion, abduction, and D2 flexion STM left pectoralis and latissimus avoiding any irritated places  PATIENT EDUCATION:  Education details: HEP, cording, how to order compression Person educated: Patient Education method: Explanation, Demonstration, Tactile cues, Verbal cues, and Handouts Education comprehension: verbalized understanding, returned demonstration, and verbal cues  required   HOME EXERCISE PROGRAM: Access Code: [email or web address] URL: [email or web address] Date: 10/21/2021 Prepared by: Gwenevere Abbot  Exercises - Supine Lower Trunk Rotation - 1 x daily - 7 x weekly - 1 sets - 6 reps - 6 second hold - Seated Lateral Trunk Stretch on Swiss Ball - 1 x daily - 7 x weekly - 1 sets - 6 reps - 5 second hold - Doorway Pec Stretch at 90 Degrees Abduction -  1 x daily - 7 x weekly - 1 sets - 3 reps - 20-30 seconds hold - Supine Shoulder Horizontal Abduction with Resistance - 1 x daily - 2-3 x weekly - 1-3 sets - 10 reps - 2-3 second hold - Supine Bilateral Shoulder External Rotation with Resistance - 1 x daily - 2-3 x weekly - 1-3 sets - 10 reps - 2-3 second hold - Supine PNF D2 Flexion with Resistance - 1 x daily - 2-3 x weekly - 1-3 sets - 10 reps - 2-3 second hold  ASSESSMENT:  CLINICAL IMPRESSION:   Pt is doing very well.  Still tight in the left pectoralis and latissimus with overhead reach but noted that pt only had 145 deg prior to surgery, so baseline has technically been met.  Will cont POC  OBJECTIVE IMPAIRMENTS decreased activity tolerance, decreased knowledge of use of DME, decreased ROM, and decreased strength.   ACTIVITY LIMITATIONS cleaning, occupation, and yard work.  AL FACTORS 1-2 comorbidities: ALND, mastectomy   are also affecting patient's functional outcome.    REHAB POTENTIAL: Excellent  CLINICAL DECISION MAKING: Stable/uncomplicated  EVALUATION COMPLEXITY: Low  GOALS: Goals reviewed with patient? Yes  SHORT TERM GOALS:  Pt will be education on lymphedema risk reduction, surveillance, and how to use compression for work` Baseline:  Target date: 10/21/2021 Goal status: MET   LONG TERM GOALS:  Pt will improve Lt shoulder AROM to within 5 deg of the Rt arm to demonstrate improved mobility and reach Baseline:  Target date: 11/04/21 Goal status: MET  2.  Pt will be ind with final stretches and strengthening to get ready  for return to work Baseline:  Target date: 11/04/21 Goal status: MET   PLAN: PT FREQUENCY: 1x/week  PT DURATION: 4 weeks  PLANNED INTERVENTIONS: Therapeutic exercises, Therapeutic activity, Neuromuscular re-education, Patient/Family education, and Manual therapy  PLAN FOR NEXT SESSION: AAROM activities, review stretches, STM/PROM, Perform MLD to decrease congestion from radiation.  Gwenevere Abbot, PT  Idamae Lusher, PT 10/22/2021, 3:00 PM    PHYSICAL THERAPY DISCHARGE SUMMARY  Visits from Start of Care: 8  Current functional level related to goals / functional outcomes: All goals met   Remaining deficits: Lymphedema risk    Education / Equipment: Per above  Plan: Patient agrees to discharge.  Patient is being discharged due to meeting the stated rehab goals.

## 2021-10-23 ENCOUNTER — Encounter: Payer: Self-pay | Admitting: Rehabilitation

## 2021-11-14 DIAGNOSIS — Z4889 Encounter for other specified surgical aftercare: Secondary | ICD-10-CM | POA: Diagnosis not present

## 2021-11-18 ENCOUNTER — Encounter: Payer: Self-pay | Admitting: Rehabilitation

## 2021-11-21 DIAGNOSIS — N6489 Other specified disorders of breast: Secondary | ICD-10-CM | POA: Diagnosis not present

## 2021-11-21 DIAGNOSIS — C50412 Malignant neoplasm of upper-outer quadrant of left female breast: Secondary | ICD-10-CM | POA: Diagnosis not present

## 2021-11-21 DIAGNOSIS — Z170 Estrogen receptor positive status [ER+]: Secondary | ICD-10-CM | POA: Diagnosis not present

## 2021-12-09 DIAGNOSIS — N6489 Other specified disorders of breast: Secondary | ICD-10-CM | POA: Diagnosis not present

## 2021-12-09 DIAGNOSIS — Z170 Estrogen receptor positive status [ER+]: Secondary | ICD-10-CM | POA: Diagnosis not present

## 2021-12-09 DIAGNOSIS — C50412 Malignant neoplasm of upper-outer quadrant of left female breast: Secondary | ICD-10-CM | POA: Diagnosis not present

## 2021-12-12 ENCOUNTER — Encounter: Payer: Self-pay | Admitting: *Deleted

## 2021-12-16 ENCOUNTER — Other Ambulatory Visit: Payer: Self-pay

## 2021-12-16 ENCOUNTER — Inpatient Hospital Stay: Payer: 59 | Attending: Hematology | Admitting: Adult Health

## 2021-12-16 VITALS — BP 132/77 | HR 75 | Temp 97.7°F | Resp 16 | Ht 61.0 in | Wt 124.6 lb

## 2021-12-16 DIAGNOSIS — Z170 Estrogen receptor positive status [ER+]: Secondary | ICD-10-CM | POA: Diagnosis not present

## 2021-12-16 DIAGNOSIS — C50412 Malignant neoplasm of upper-outer quadrant of left female breast: Secondary | ICD-10-CM | POA: Insufficient documentation

## 2021-12-16 DIAGNOSIS — R5383 Other fatigue: Secondary | ICD-10-CM | POA: Diagnosis not present

## 2021-12-16 DIAGNOSIS — F17210 Nicotine dependence, cigarettes, uncomplicated: Secondary | ICD-10-CM | POA: Diagnosis not present

## 2021-12-16 DIAGNOSIS — Z923 Personal history of irradiation: Secondary | ICD-10-CM | POA: Diagnosis not present

## 2021-12-16 DIAGNOSIS — Z79899 Other long term (current) drug therapy: Secondary | ICD-10-CM | POA: Diagnosis not present

## 2021-12-16 DIAGNOSIS — M85851 Other specified disorders of bone density and structure, right thigh: Secondary | ICD-10-CM | POA: Insufficient documentation

## 2021-12-16 DIAGNOSIS — Z79811 Long term (current) use of aromatase inhibitors: Secondary | ICD-10-CM | POA: Insufficient documentation

## 2021-12-16 DIAGNOSIS — Z9012 Acquired absence of left breast and nipple: Secondary | ICD-10-CM | POA: Diagnosis not present

## 2021-12-16 DIAGNOSIS — R69 Illness, unspecified: Secondary | ICD-10-CM | POA: Diagnosis not present

## 2021-12-16 NOTE — Progress Notes (Unsigned)
SURVIVORSHIP VISIT:   BRIEF ONCOLOGIC HISTORY:  Oncology History Overview Note   Cancer Staging  Malignant neoplasm of upper-outer quadrant of left breast in female, estrogen receptor positive ([place]) Staging form: Breast, AJCC 8th Edition - Clinical stage from 04/11/2021: Stage IB (cT2, cN0, cM0, G2, ER+, PR+, HER2-) - Signed by Truitt Merle, MD on 04/22/2021 - Pathologic stage from 06/12/2021: Stage IB (pT2, pN1a, cM0, G2, ER+, PR+, HER2-, Oncotype DX score: 14) - Signed by Truitt Merle, MD on 09/15/2021     Malignant neoplasm of upper-outer quadrant of left breast in female, estrogen receptor positive ([place])  03/27/2021 Mammogram   EXAM: DIGITAL DIAGNOSTIC BILATERAL MAMMOGRAM WITH TOMOSYNTHESIS AND CAD; ULTRASOUND LEFT BREAST LIMITED  IMPRESSION: Highly suspicious mass in the left breast at 12 o'clock, 4 cm from the nipple. Tiny satellite lesion 2 mm from the dominant mass. There is a group of calcifications with associated density lateral and inferior to the mass. There are calcifications between the density in the dominant mass as well.   04/11/2021 Cancer Staging   Staging form: Breast, AJCC 8th Edition - Clinical stage from 04/11/2021: Stage IB (cT2, cN0, cM0, G2, ER+, PR+, HER2-) - Signed by Truitt Merle, MD on 04/22/2021 Stage prefix: Initial diagnosis Histologic grading system: 3 grade system   04/11/2021 Initial Biopsy   Diagnosis 1. Breast, left, needle core biopsy, 12 o'clock, 4cmfn - INVASIVE MAMMARY CARCINOMA - MAMMARY CARCINOMA IN SITU - SEE COMMENT 2. Breast, left, needle core biopsy, upper outer quadrant - INVASIVE MAMMARY CARCINOMA - SEE COMMENT Microscopic Comment 1. and 2. The biopsy material shows an infiltrative proliferation of cells arranged linearly and in small clusters. Based on the biopsy, the carcinoma appears Nottingham grade 2 of 3 and measures 1.5 cm in greatest linear extent.  2. There are microcalcifications associated with benign breast tissue (invasive  carcinoma) in the biopsy material.  1. and 2. E-cadherin is NEGATIVE supporting lobular origin.  1. PROGNOSTIC INDICATORS Results: The tumor cells are EQUIVOCAL for Her2 (2+). Her2 by FISH will be performed and results reported separately. Estrogen Receptor: 80%, POSITIVE, STRONG STAINING INTENSITY Progesterone Receptor: 70%, POSITIVE, STRONG STAINING INTENSITY Proliferation Marker Ki67: 15%  1. FLUORESCENCE IN-SITU HYBRIDIZATION Results: GROUP 5: HER2 **NEGATIVE**   04/21/2021 Initial Diagnosis   Malignant neoplasm of upper-outer quadrant of left breast in female, estrogen receptor positive ([place])   04/28/2021 Imaging   EXAM: BILATERAL BREAST MRI WITH AND WITHOUT CONTRAST  IMPRESSION: 1. Biopsy-proven malignancy in the upper left breast spanning approximately 5.3 x 3.3 x 4.9 cm. Two post biopsy clips are located centrally within the mass and are located 2 cm from one another. 2. No MRI evidence of malignancy elsewhere within the left breast or within the right breast. 3. No suspicious lymphadenopathy. 4. 7 mm nonenhancing T2 hyperintense mass along the inferior portions of the visualized liver. The appearance is suggestive of a hepatic cyst but is not fully characterized on today's study. Recommend further cross-sectional evaluation.   06/12/2021 Cancer Staging   Staging form: Breast, AJCC 8th Edition - Pathologic stage from 06/12/2021: Stage IB (pT2, pN1a, cM0, G2, ER+, PR+, HER2-, Oncotype DX score: 14) - Signed by Truitt Merle, MD on 09/15/2021 Stage prefix: Initial diagnosis Multigene prognostic tests performed: Oncotype DX Recurrence score range: Greater than or equal to 11 Histologic grading system: 3 grade system Residual tumor (R): R0 - None   06/12/2021 Definitive Surgery   FINAL MICROSCOPIC DIAGNOSIS:   A. 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 LYMPH NODE, LEFT AXILLA #1, SENTINEL, EXCISION:  Metastatic lobular carcinoma (1/1)  Extranodal extension present   C. LYMPH NODE, LEFT AXILLA #2, SENTINEL, EXCISION:  Metastatic lobular carcinoma (1/1)  Extranodal extension present   D. LYMPH NODE, LEFT AXILLA #3, SENTINEL, EXCISION:  Metastatic lobular carcinoma (1/1)   E. LYMPH NODE, LEFT AXILLA, SENTINEL, EXCISION:  One benign lymph node, negative for carcinoma (0/1)   F. LYMPH NODE, LEFT AXILLA, SENTINEL, EXCISION:  One benign lymph node, negative for carcinoma (0/1)    07/02/2021 Pathology Results   FINAL MICROSCOPIC DIAGNOSIS:   A. LYMPH NODES, LEFT AXILLARY, DISSECTION:  -  No carcinoma identified in fourteen lymph node (0/14)  -  Soft tissue with previous surgical site changes including foreign  body giant cell reaction  -  See comment   COMMENT:  Given the patient's history of pleomorphic lobular carcinoma,  cytokeratin AE1/3 is performed on submitted blocks to assess for the presence of isolated tumor cells.  The immunohistochemistry supports the absence of metastatic disease.    08/05/2021 - 09/15/2021 Radiation Therapy   Site Technique Total Dose (Gy) Dose per Fx (Gy) Completed Fx Beam Energies  Chest Wall, Left: CW_L 3D 50/50 2 25/25 6XFFF  Sclav-LT: SCV_L 3D 50/50 2 25/25 6X, 10X  Chest Wall, Left: CW_L_Bst Electron 10/10 2 5/5 6E     10/2021 -  Anti-estrogen oral therapy   Letrozole     INTERVAL HISTORY:  Ms. Menard to review her survivorship care plan detailing her treatment course for breast cancer, as well as monitoring long-term side effects of that treatment, education regarding health maintenance, screening, and overall wellness and health promotion.     Overall, Ms. Mickler reports feeling quite well. She is taking Letrozole daily and is tolerating it moderately well.  She  notes her left chest wall mastectomy site opened up and is draining somewhat, but healing.  She denies any purulent drainage, fever, chills, odor.  She notes it is improving.    REVIEW OF SYSTEMS:  Review of Systems  Constitutional:  Positive for fatigue. Negative for appetite change, chills, fever and unexpected weight change.  HENT:   Negative for hearing loss, lump/mass and trouble swallowing.   Eyes:  Negative for eye problems and icterus.  Respiratory:  Negative for chest tightness, cough and shortness of breath.   Cardiovascular:  Negative for chest pain, leg swelling and palpitations.  Gastrointestinal:  Negative for abdominal distention, abdominal pain, constipation, diarrhea, nausea and vomiting.  Endocrine: Negative for hot flashes.  Genitourinary:  Negative for difficulty urinating.   Musculoskeletal:  Negative for arthralgias.  Skin:  Negative for itching and rash.  Neurological:  Negative for dizziness, extremity weakness, headaches and numbness.  Hematological:  Negative for adenopathy. Does not bruise/bleed easily.  Psychiatric/Behavioral:  Negative for depression. The patient is not nervous/anxious.   Breast: Denies any new nodularity, masses, tenderness, nipple changes, or nipple discharge.      ONCOLOGY TREATMENT TEAM:  1. Surgeon:  Dr. Barry Dienes at [facility] 2. Medical Oncologist: Dr. Burr Medico  3. Radiation Oncologist: Dr. Sondra Come    PAST MEDICAL/SURGICAL HISTORY:  Past Medical History:  Diagnosis Date   Breast cancer ([place])    Left   GERD (gastroesophageal reflux disease)    diet  controlled   Heart murmur    no current problems   History of radiation therapy    Left chest wall- 08/05/21-09/15/21- Dr. Gery Pray   Hypertension    Mitral valve prolapse    Past Surgical History:  Procedure Laterality Date   CESAREAN SECTION     x 2 in 1991 and 1993   MASTECTOMY W/ SENTINEL NODE BIOPSY Left 06/12/2021   Procedure: LEFT MASTECTOMY WITH SENTINEL  LYMPH NODE BIOPSY;  Surgeon: Stark Klein, MD;  Location: [facility];  Service: General;  Laterality: Left;   NODE DISSECTION Left 07/02/2021   Procedure: LEFT AXILLARY LYMPH NODE DISSECTION;  Surgeon: Stark Klein, MD;  Location: [facility];  Service: General;  Laterality: Left;   TUBAL LIGATION  1993   WISDOM TOOTH EXTRACTION       ALLERGIES:  No Known Allergies   CURRENT MEDICATIONS:  Outpatient Encounter Medications as of 12/16/2021  Medication Sig   acetaminophen (TYLENOL) 325 MG tablet Take 650 mg by mouth every 6 (six) hours as needed for moderate pain.   ciclopirox (PENLAC) 8 % solution Apply 1 drop topically at bedtime.   fluticasone (FLONASE) 50 MCG/ACT nasal spray Place 2 sprays into both nostrils at bedtime.   ibuprofen (ADVIL) 600 MG tablet Take 1 tablet (600 mg total) by mouth every 8 (eight) hours as needed for mild pain or moderate pain.   letrozole (FEMARA) 2.5 MG tablet Take 1 tablet (2.5 mg total) by mouth daily.   losartan-hydrochlorothiazide (HYZAAR) 50-12.5 MG tablet Take 1 tablet by mouth daily.   Multiple Vitamin (MULTIVITAMIN WITH MINERALS) TABS tablet Take 1 tablet by mouth daily.   Apple Cider Vinegar 500 MG TABS Take 500 mg by mouth in the morning and at bedtime. (Patient not taking: Reported on 12/16/2021)   gabapentin (NEURONTIN) 100 MG capsule Take 2 capsules (200 mg total) by mouth 2 (two) times daily. (Patient not taking: Reported on 12/16/2021)   methocarbamol (ROBAXIN) 500 MG tablet Take 1 tablet (500 mg total) by mouth every 6 (six) hours as needed for muscle spasms. (Patient not taking: Reported on 12/16/2021)   oxyCODONE (OXY IR/ROXICODONE) 5 MG immediate release tablet Take 1 tablet (5 mg total) by mouth every 6 (six) hours as needed for severe pain or breakthrough pain. (Patient not taking: Reported on 12/16/2021)   No facility-administered encounter medications on file as of 12/16/2021.     ONCOLOGIC FAMILY HISTORY:  Family History  Problem Relation Age of Onset    Cancer Father 54       Oral   Breast cancer Paternal Grandmother    Lung cancer Paternal Building services engineer   Colon cancer Other    Colon cancer Paternal Great-grandmother    Colon cancer Paternal Great-grandfather    Leukemia Half-Brother     SOCIAL HISTORY:  Social History   Socioeconomic History   Marital status: Married    Spouse name: Tommy   Number of children: 2   Years of education: Not on file   Highest education level: Not on file  Occupational History   Not on file  Tobacco Use   Smoking status: Every Day    Packs/day: 1.00    Years: 37.00    Total pack years: 37.00    Types: Cigarettes   Smokeless tobacco: Never  Vaping Use   Vaping Use: Never used  Substance and Sexual Activity   Alcohol use: Yes    Alcohol/week: 4.0 standard drinks of alcohol  Types: 4 Cans of beer per week    Comment: 30 years   Drug use: No   Sexual activity: Not Currently  Other Topics Concern   Not on file  Social History Narrative   Not on file   Social Determinants of Health   Financial Resource Strain: Not on file  Food Insecurity: No Food Insecurity (04/23/2021)   Hunger Vital Sign    Worried About Running Out of Food in the Last Year: Never true    Ran Out of Food in the Last Year: Never true  Transportation Needs: No Transportation Needs (04/23/2021)   PRAPARE - Hydrologist (Medical): No    Lack of Transportation (Non-Medical): No  Physical Activity: Not on file  Stress: Not on file  Social Connections: Not on file  Intimate Partner Violence: Not on file     OBSERVATIONS/OBJECTIVE:  BP 132/77 (BP Location: Right Arm, Patient Position: Sitting)   Pulse 75   Temp 97.7 F (36.5 C) (Temporal)   Resp 16   Ht 5' 1"  (1.549 m)   Wt 124 lb 9.6 oz (56.5 kg)   SpO2 99%   BMI 23.54 kg/m  GENERAL: Patient is a well appearing female in no acute distress HEENT:  Sclerae anicteric.  Oropharynx clear and moist. No ulcerations or  evidence of oropharyngeal candidiasis. Neck is supple.  NODES:  No cervical, supraclavicular, or axillary lymphadenopathy palpated.  BREAST EXAM:  Deferred. LUNGS:  Clear to auscultation bilaterally.  No wheezes or rhonchi. HEART:  Regular rate and rhythm. No murmur appreciated. ABDOMEN:  Soft, nontender.  Positive, normoactive bowel sounds. No organomegaly palpated. MSK:  No focal spinal tenderness to palpation. Full range of motion bilaterally in the upper extremities. EXTREMITIES:  No peripheral edema.   SKIN:  Clear with no obvious rashes or skin changes. No nail dyscrasia. NEURO:  Nonfocal. Well oriented.  Appropriate affect.   LABORATORY DATA:  None for this visit.  DIAGNOSTIC IMAGING:  None for this visit.    ASSESSMENT AND PLAN:  Ms.. Bokhari is a pleasant 56 y.o. female with Stage IB left breast invasive ductal carcinoma, ER+/PR+/HER2-, diagnosed in 04/2022, treated with mastectomy, adjuvant radiation therapy, and anti-estrogen therapy with Letrozole beginning in 10/2021.  She presents to the Survivorship Clinic for our initial meeting and routine follow-up post-completion of treatment for breast cancer.    1. Stage IB left breast cancer:  Ms. Name is continuing to recover from definitive treatment for breast cancer. She will follow-up with her medical oncologist, Dr. Burr Medico in 03/2022 with history and physical exam per surveillance protocol.  She will continue her anti-estrogen therapy with Letrozole. Thus far, she is tolerating the Letrozole well, with minimal side effects. She was instructed to make Dr. Lindi Adie or myself aware if she begins to experience any worsening side effects of the medication and I could see her back in clinic to help manage those side effects, as needed. I recommended that she continue annual right breast screening mammograms.  Her left mastectomy site is healing.  I took a picture and will send it to Dr. Barry Dienes.   Today, a comprehensive survivorship care plan and  treatment summary was reviewed with the patient today detailing her breast cancer diagnosis, treatment course, potential late/long-term effects of treatment, appropriate follow-up care with recommendations for the future, and patient education resources.  A copy of this summary, along with a letter will be sent to the patient's primary care provider via mail/fax/In Basket message  after today's visit.    2. Bone health:  Given Ms. Clary age/history of breast cancer and her current treatment regimen including anti-estrogen therapy with Letrozole, she is at risk for bone demineralization.  Her last DEXA scan was 09/25/2021, which showed osteopenia with a tscore of 1.8 in the right femur.  She was given education on specific activities to promote bone health.  3. Cancer screening:  Due to Ms. Espiritu history and her age, she should receive screening for skin cancers, colon cancer, and gynecologic cancers.  The information and recommendations are listed on the patient's comprehensive care plan/treatment summary and were reviewed in detail with the patient.    4. Health maintenance and wellness promotion: Ms. Feijoo was encouraged to consume 5-7 servings of fruits and vegetables per day. We reviewed the "Nutrition Rainbow" handout.  She was also encouraged to engage in moderate to vigorous exercise for 30 minutes per day most days of the week. We discussed the [employer] fitness program, which is designed for cancer survivors to help them become more physically fit after cancer treatments.  She was instructed to limit her alcohol consumption and continue to abstain from tobacco use.     5. Support services/counseling: It is not uncommon for this period of the patient's cancer care trajectory to be one of many emotions and stressors.   She was given information regarding our available services and encouraged to contact me with any questions or for help enrolling in any of our support group/programs.    Follow  up instructions:    -Return to cancer center in 03/2022 for f/u with Dr. Burr Medico  -Mammogram due in 03/2022 -Bone density 09/2023 -She is welcome to return back to the Survivorship Clinic at any time; no additional follow-up needed at this time.  -Consider referral back to survivorship as a long-term survivor for continued surveillance  The patient was provided an opportunity to ask questions and all were answered. The patient agreed with the plan and demonstrated an understanding of the instructions.   Total encounter time:40 minutes*in face-to-face visit time, chart review, lab review, care coordination, order entry, and documentation of the encounter time.  Wilber Bihari, NP 12/16/21 10:50 AM Medical Oncology and Hematology [facility] [place], [place] [address] Tel. [phone]    Fax. [phone]  *Total Encounter Time as defined by the Centers for Medicare and Medicaid Services includes, in addition to the face-to-face time of a patient visit (documented in the note above) non-face-to-face time: obtaining and reviewing outside history, ordering and reviewing medications, tests or procedures, care coordination (communications with other health care professionals or caregivers) and documentation in the medical record.

## 2021-12-17 ENCOUNTER — Encounter: Payer: Self-pay | Admitting: Adult Health

## 2021-12-17 DIAGNOSIS — D72820 Lymphocytosis (symptomatic): Secondary | ICD-10-CM | POA: Insufficient documentation

## 2021-12-17 DIAGNOSIS — I10 Essential (primary) hypertension: Secondary | ICD-10-CM | POA: Insufficient documentation

## 2021-12-17 DIAGNOSIS — Z720 Tobacco use: Secondary | ICD-10-CM | POA: Insufficient documentation

## 2021-12-17 DIAGNOSIS — B351 Tinea unguium: Secondary | ICD-10-CM | POA: Insufficient documentation

## 2021-12-17 NOTE — Addendum Note (Signed)
Addended by: Scot Dock on: 12/17/2021 08:05 PM   Modules accepted: Level of Service

## 2021-12-31 DIAGNOSIS — C50412 Malignant neoplasm of upper-outer quadrant of left female breast: Secondary | ICD-10-CM | POA: Diagnosis not present

## 2021-12-31 DIAGNOSIS — Z170 Estrogen receptor positive status [ER+]: Secondary | ICD-10-CM | POA: Diagnosis not present

## 2022-01-01 ENCOUNTER — Ambulatory Visit: Payer: 59 | Admitting: Plastic Surgery

## 2022-02-20 ENCOUNTER — Other Ambulatory Visit: Payer: Self-pay | Admitting: Nurse Practitioner

## 2022-02-20 DIAGNOSIS — Z170 Estrogen receptor positive status [ER+]: Secondary | ICD-10-CM

## 2022-03-18 ENCOUNTER — Ambulatory Visit: Payer: No Typology Code available for payment source | Admitting: Hematology

## 2022-03-18 ENCOUNTER — Other Ambulatory Visit: Payer: No Typology Code available for payment source

## 2022-04-09 ENCOUNTER — Ambulatory Visit: Payer: 59

## 2022-06-01 ENCOUNTER — Telehealth: Payer: Self-pay | Admitting: *Deleted

## 2022-06-01 NOTE — Telephone Encounter (Signed)
Called patient to ask about rescheduling fu on 06/18/22 due to Dr. Sondra Come being off, spoke with patient and she is starting a new job and she will call me and let me know if she can do a 06-25-22 fu with Dr. Sondra Come

## 2022-06-18 ENCOUNTER — Ambulatory Visit: Payer: Self-pay | Admitting: Radiation Oncology

## 2022-06-23 ENCOUNTER — Telehealth: Payer: Self-pay

## 2022-06-23 NOTE — Telephone Encounter (Signed)
Patient called in to cancel upcoming appointment with Dr. Sondra Come on 2/15, per patient she has moved to [facility] Patient requesting a referral be placed to a cancer center in [place] for all future follow ups.

## 2022-06-25 ENCOUNTER — Other Ambulatory Visit: Payer: Self-pay | Admitting: Nurse Practitioner

## 2022-06-25 ENCOUNTER — Ambulatory Visit: Payer: 59 | Admitting: Radiation Oncology

## 2022-06-25 DIAGNOSIS — Z170 Estrogen receptor positive status [ER+]: Secondary | ICD-10-CM

## 2022-06-26 ENCOUNTER — Other Ambulatory Visit: Payer: Self-pay

## 2022-06-26 DIAGNOSIS — Z170 Estrogen receptor positive status [ER+]: Secondary | ICD-10-CM

## 2022-06-26 MED ORDER — LETROZOLE 2.5 MG PO TABS: 2.5000 mg | ORAL_TABLET | Freq: Every day | ORAL | 0 refills | Status: AC

## 2022-07-31 ENCOUNTER — Inpatient Hospital Stay: Payer: 59 | Admitting: Hematology

## 2022-07-31 ENCOUNTER — Inpatient Hospital Stay: Payer: 59

## 2022-09-14 ENCOUNTER — Telehealth: Payer: Self-pay

## 2022-09-14 NOTE — Telephone Encounter (Signed)
Patient called in to make Dr. Roselind Messier aware that she has established care with Dr. Tasia Catchings in [place]

## 2023-03-13 LAB — EXTERNAL GENERIC LAB PROCEDURE: COLOGUARD: POSITIVE — AB

## 2023-08-07 IMAGING — MG MM BREAST BX W LOC DEV 1ST LESION IMAGE BX SPEC STEREO GUIDE*L*
7 of 12 series · 7 of 24 positions shown · non-contrast
Comparison: Previous exam(s).
COMPARISON: Previous exam(s).

Addendum:
CLINICAL DATA: Patient presents for ultrasound-guided core needle
biopsy of a 3.9 cm highly suspicious left breast mass and
stereotactic core needle biopsy an adjacent asymmetry with
associated calcifications.

EXAM:
ULTRASOUND GUIDED LEFT BREAST CORE NEEDLE BIOPSY: BIOPSY #1.
STEREOTACTIC GUIDED LEFT BREAST CORE NEEDLE BIOPSY: BIOPSY #2.

[L (1 of 7)]
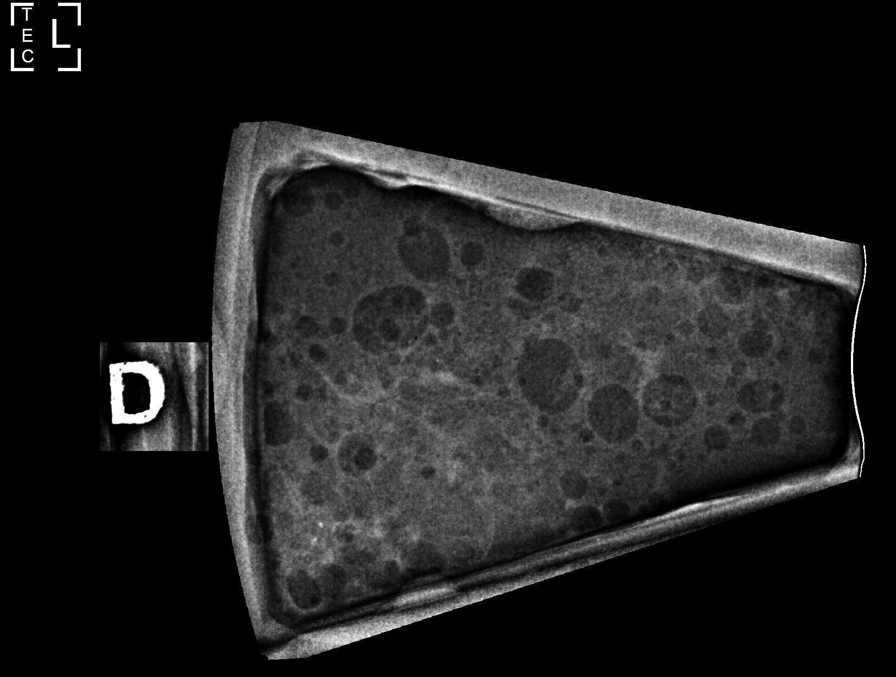

[L (2 of 7)]
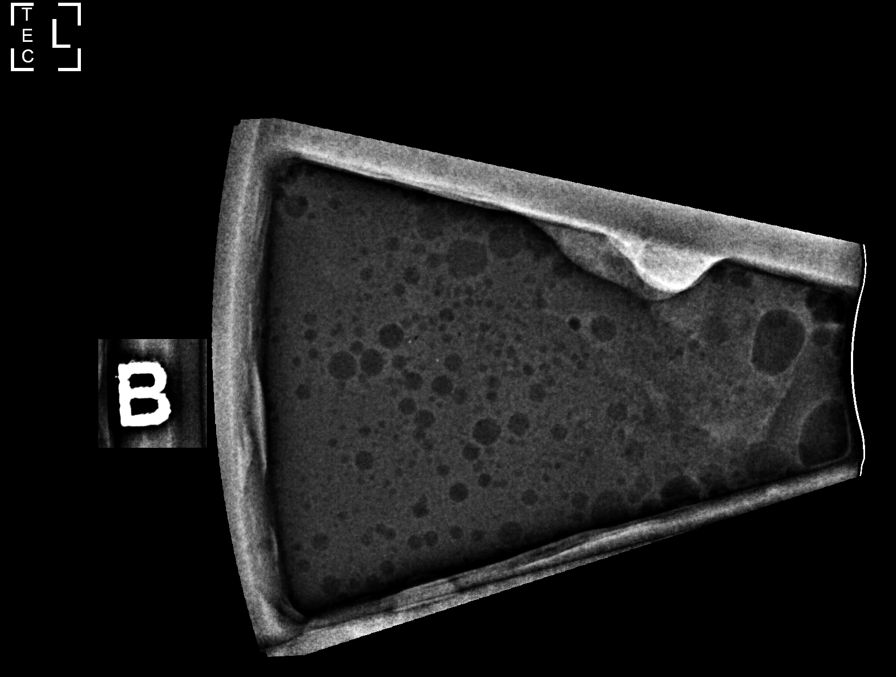

[L (3 of 7)]
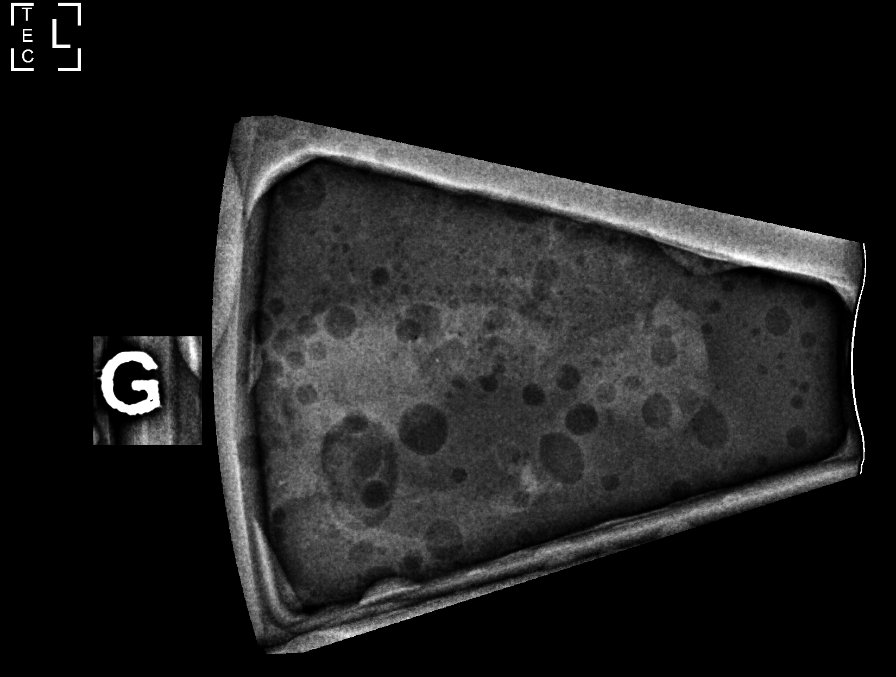

[L (4 of 7)]
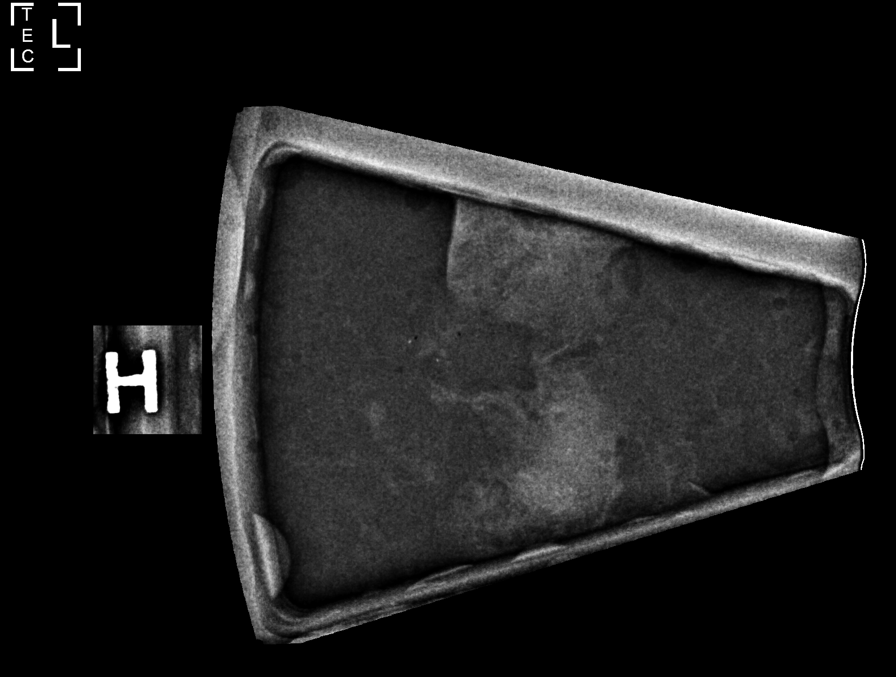

[L (5 of 7)]
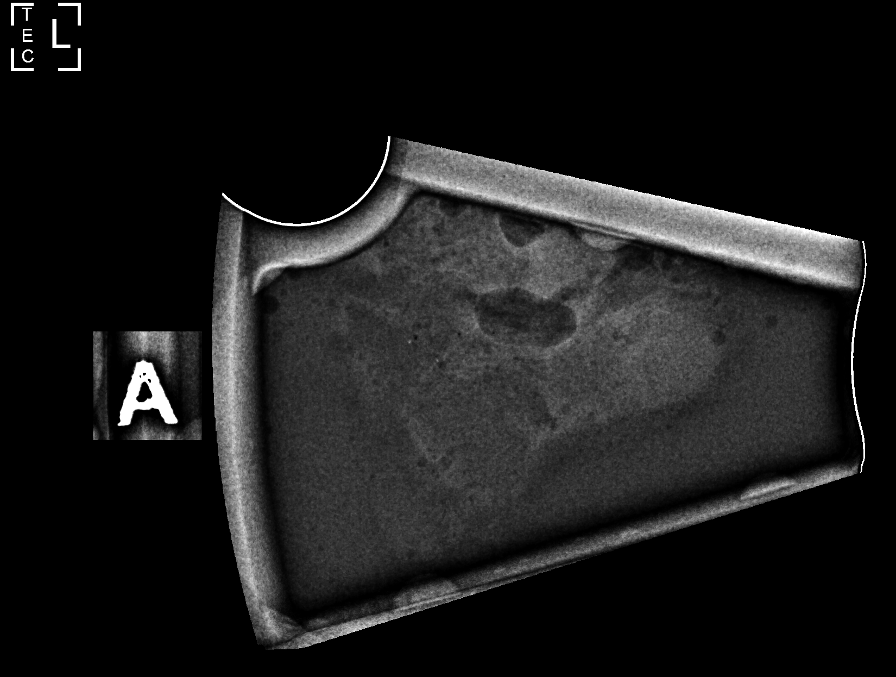

[L (6 of 7)]
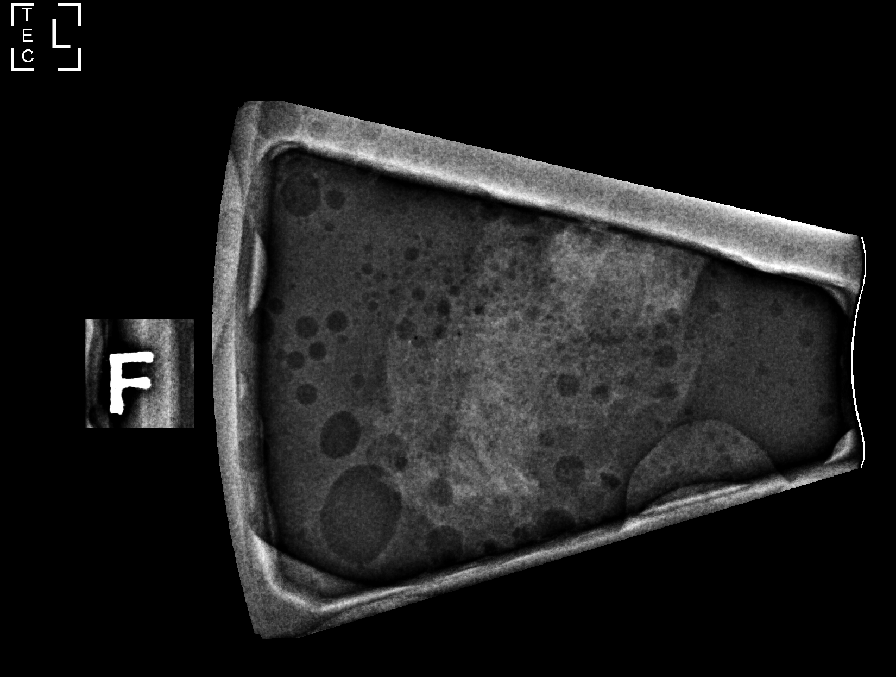

[L (7 of 7)]
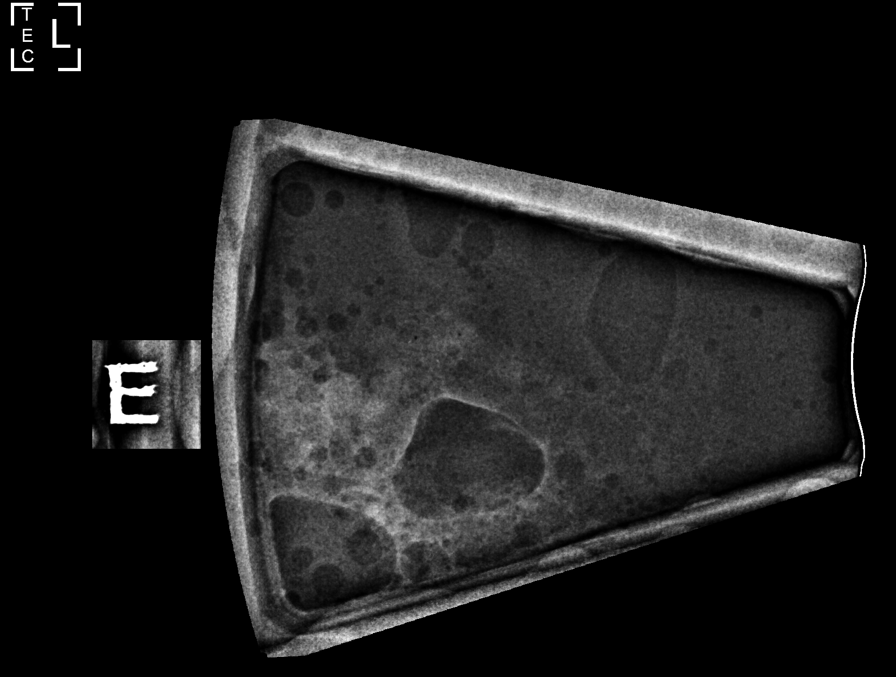

[7 of 24 positions shown; findings below may reference images not displayed]

PROCEDURE:
BIOPSY #1: 3.9 CM 12 O'CLOCK POSITION BREAST MASS.

I met with the patient and we discussed the procedure of
ultrasound-guided biopsy, including benefits and alternatives. We
discussed the high likelihood of a successful procedure. We
discussed the risks of the procedure, including infection, bleeding,
tissue injury, clip migration, and inadequate sampling. Informed
written consent was given. The usual time-out protocol was performed
immediately prior to the procedure.

Lesion quadrant: Upper outer quadrant, near 12 o'clock, 4 cm the
nipple.

Using sterile technique and 1% Lidocaine as local anesthetic, under
direct ultrasound visualization, a 12 gauge Batai device was
used to perform biopsy of the 3.9 cm spiculated mass using an
inferior approach. At the conclusion of the procedure a ribbon
shaped tissue marker clip was deployed into the biopsy cavity.

BIOPSY #2: 1.3 CM SPAN CALCIFICATIONS AND ASSOCIATED ASYMMETRY.

The patient and I discussed the procedure of stereotactic-guided
biopsy including benefits and alternatives. We discussed the high
likelihood of a successful procedure. We discussed the risks of the
procedure including infection, bleeding, tissue injury, clip
migration, and inadequate sampling. Informed written consent was
given. The usual time out protocol was performed immediately prior
to the procedure.

Using sterile technique and 1% Lidocaine as local anesthetic, under
stereotactic guidance, a 9 gauge vacuum assisted device was used to
perform core needle biopsy of calcifications and associated
asymmetry lying 2 cm lateral and slightly inferior to the 12 o'clock
position mass, using a superior approach. Specimen radiograph was
performed showing several calcifications for which biopsy was
performed. Specimens with calcifications are identified for
pathology.

Lesion quadrant: Upper outer quadrant

At the conclusion of the procedure, an X shaped tissue marker clip
was deployed into the biopsy cavity.

Follow up 2 view mammogram was performed and dictated separately.
IMPRESSION: Ultrasound guided biopsy of a left breast mass.

Stereotactic guided biopsy left breast calcifications and asymmetry
2 cm lateral and slightly inferior to the left breast mass.

No apparent complications.

ADDENDUM:
1- Pathology revealed GRADE II INVASIVE MAMMARY CARCINOMA- MAMMARY
CARCINOMA IN SITU of the LEFT breast, 12 o'clock, 7cmfn (ribbon
clip). This was found to be concordant by Dr. Gvtiso Besprozvannii.

2- Pathology revealed GRADE II INVASIVE MAMMARY CARCINOMA of the
LEFT breast, upper outer quadrant (X clip). This was found to be
concordant by Dr. Gvtiso Besprozvannii.

Note:

1. and 2. E-cadherin is NEGATIVE supporting lobular origin.

2. There are microcalcifications associated with benign breast
tissue (invasive carcinoma) in the biopsy material.

Pathology results were discussed with the patient by telephone. The
patient reported doing well after the biopsies with tenderness at
the sites. Post biopsy instructions and care were reviewed and
questions were answered. The patient was encouraged to call The

The patient was referred to [REDACTED]
[REDACTED] at [REDACTED] on
April 23, 2021.

Pathology results reported by Nana-Chan Bohler RN on 04/15/2021.

*** End of Addendum ***
PROCEDURE:
BIOPSY #1: 3.9 CM 12 O'CLOCK POSITION BREAST MASS.

I met with the patient and we discussed the procedure of
ultrasound-guided biopsy, including benefits and alternatives. We
discussed the high likelihood of a successful procedure. We
discussed the risks of the procedure, including infection, bleeding,
tissue injury, clip migration, and inadequate sampling. Informed
written consent was given. The usual time-out protocol was performed
immediately prior to the procedure.

Lesion quadrant: Upper outer quadrant, near 12 o'clock, 4 cm the
nipple.

Using sterile technique and 1% Lidocaine as local anesthetic, under
direct ultrasound visualization, a 12 gauge Batai device was
used to perform biopsy of the 3.9 cm spiculated mass using an
inferior approach. At the conclusion of the procedure a ribbon
shaped tissue marker clip was deployed into the biopsy cavity.

BIOPSY #2: 1.3 CM SPAN CALCIFICATIONS AND ASSOCIATED ASYMMETRY.

The patient and I discussed the procedure of stereotactic-guided
biopsy including benefits and alternatives. We discussed the high
likelihood of a successful procedure. We discussed the risks of the
procedure including infection, bleeding, tissue injury, clip
migration, and inadequate sampling. Informed written consent was
given. The usual time out protocol was performed immediately prior
to the procedure.

Using sterile technique and 1% Lidocaine as local anesthetic, under
stereotactic guidance, a 9 gauge vacuum assisted device was used to
perform core needle biopsy of calcifications and associated
asymmetry lying 2 cm lateral and slightly inferior to the 12 o'clock
position mass, using a superior approach. Specimen radiograph was
performed showing several calcifications for which biopsy was
performed. Specimens with calcifications are identified for
pathology.

Lesion quadrant: Upper outer quadrant

At the conclusion of the procedure, an X shaped tissue marker clip
was deployed into the biopsy cavity.

Follow up 2 view mammogram was performed and dictated separately.
IMPRESSION: Ultrasound guided biopsy of a left breast mass.

Stereotactic guided biopsy left breast calcifications and asymmetry
2 cm lateral and slightly inferior to the left breast mass.

No apparent complications.

## 2023-08-07 IMAGING — US US BREAST BX W LOC DEV 1ST LESION IMG BX SPEC US GUIDE*L*
1 series · 11 of 11 positions shown · non-contrast
Comparison: Previous exam(s).
COMPARISON: Previous exam(s).

Addendum:
CLINICAL DATA: Patient presents for ultrasound-guided core needle
biopsy of a 3.9 cm highly suspicious left breast mass and
stereotactic core needle biopsy an adjacent asymmetry with
associated calcifications.

EXAM:
ULTRASOUND GUIDED LEFT BREAST CORE NEEDLE BIOPSY: BIOPSY #1.
STEREOTACTIC GUIDED LEFT BREAST CORE NEEDLE BIOPSY: BIOPSY #2.

[Series 1: us breast bx w loc dev 1st lesion img bx spec us g · 0.07mm/px · 11 of 11 slices shown]
[im 1/11]
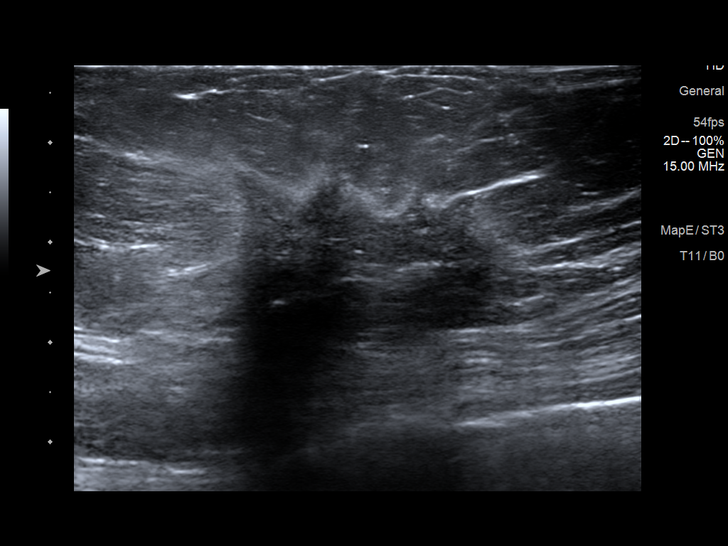
[im 2/11]
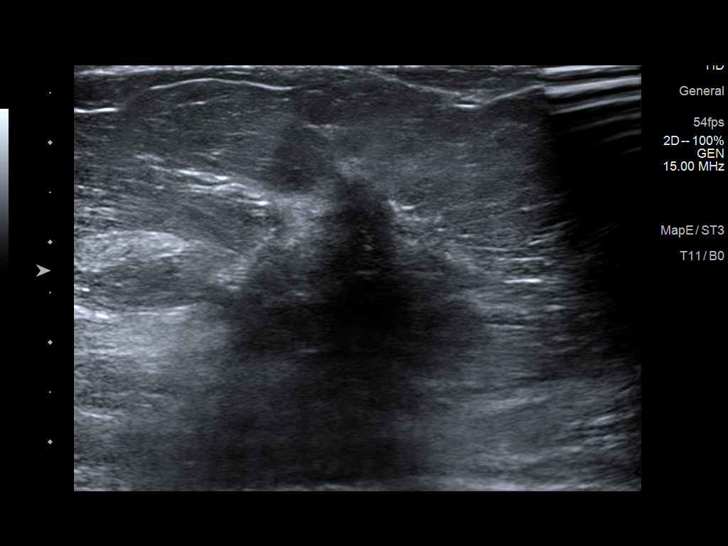
[im 3/11]
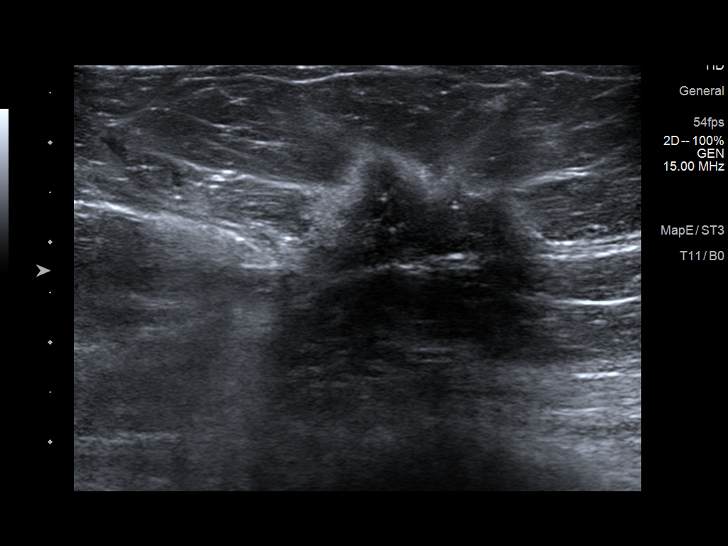
[im 4/11]
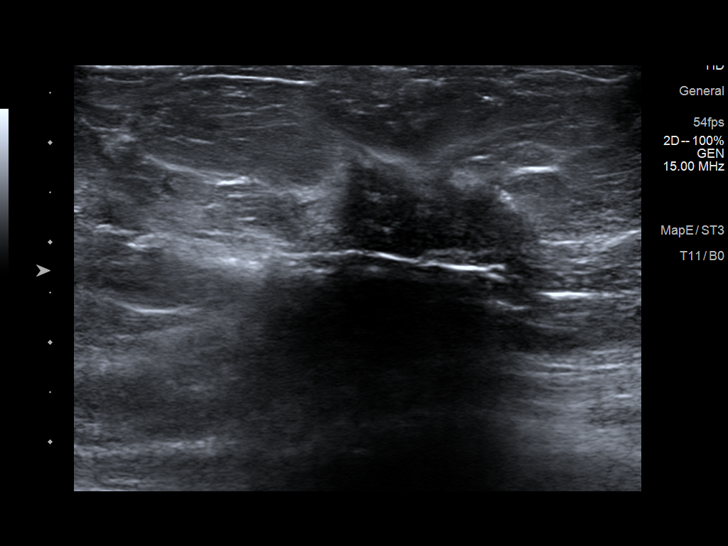
[im 5/11]
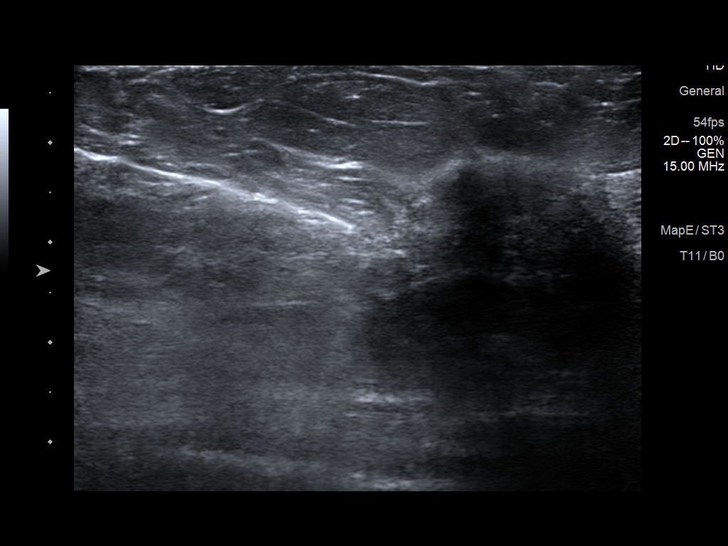
[im 6/11]
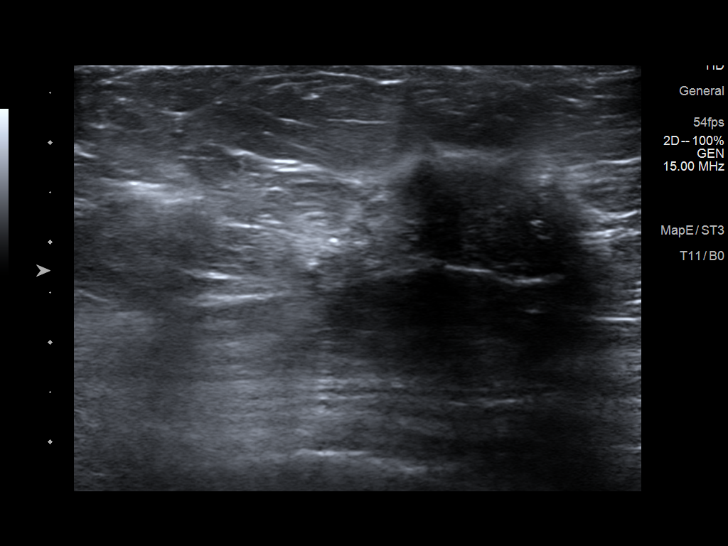
[im 7/11]
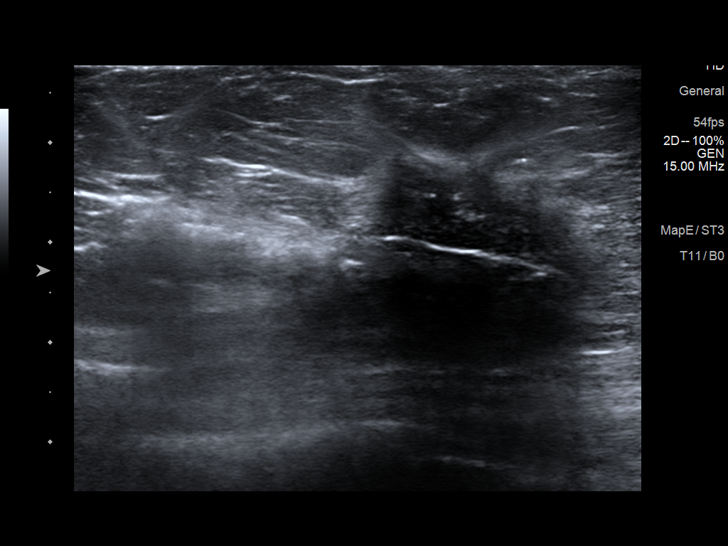
[im 8/11]
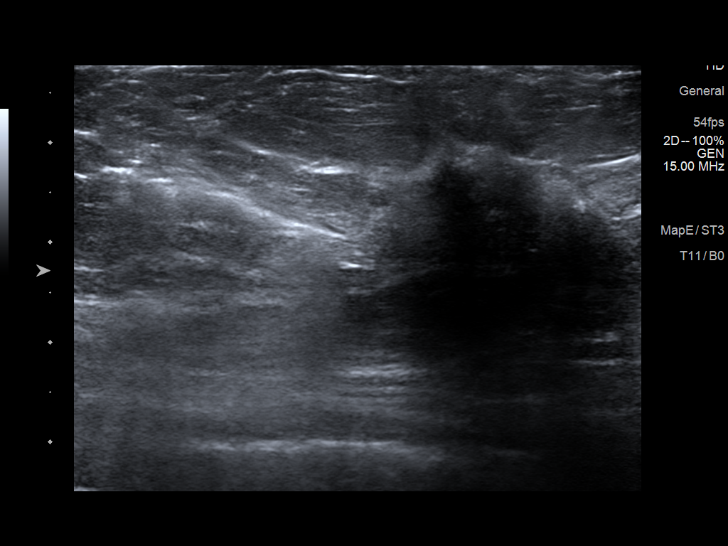
[im 9/11]
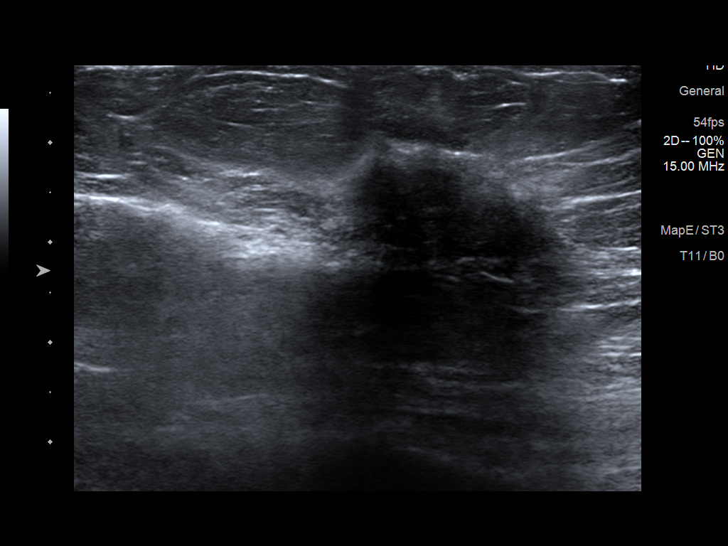
[im 10/11]
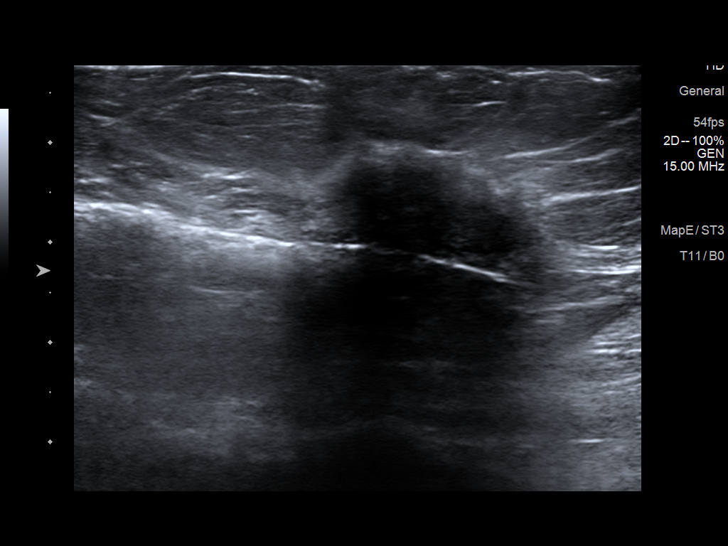
[im 11/11]
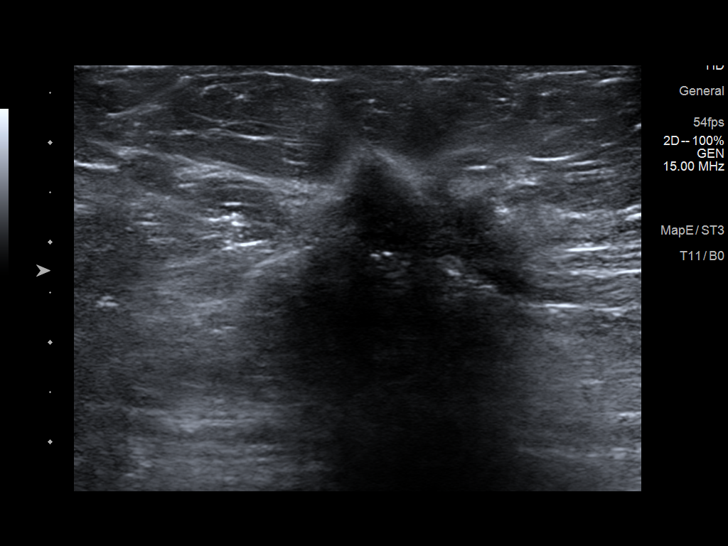

[11 of 11 positions shown; findings below may reference images not displayed]

PROCEDURE:
BIOPSY #1: 3.9 CM 12 O'CLOCK POSITION BREAST MASS.

I met with the patient and we discussed the procedure of
ultrasound-guided biopsy, including benefits and alternatives. We
discussed the high likelihood of a successful procedure. We
discussed the risks of the procedure, including infection, bleeding,
tissue injury, clip migration, and inadequate sampling. Informed
written consent was given. The usual time-out protocol was performed
immediately prior to the procedure.

Lesion quadrant: Upper outer quadrant, near 12 o'clock, 4 cm the
nipple.

Using sterile technique and 1% Lidocaine as local anesthetic, under
direct ultrasound visualization, a 12 gauge Batai device was
used to perform biopsy of the 3.9 cm spiculated mass using an
inferior approach. At the conclusion of the procedure a ribbon
shaped tissue marker clip was deployed into the biopsy cavity.

BIOPSY #2: 1.3 CM SPAN CALCIFICATIONS AND ASSOCIATED ASYMMETRY.

The patient and I discussed the procedure of stereotactic-guided
biopsy including benefits and alternatives. We discussed the high
likelihood of a successful procedure. We discussed the risks of the
procedure including infection, bleeding, tissue injury, clip
migration, and inadequate sampling. Informed written consent was
given. The usual time out protocol was performed immediately prior
to the procedure.

Using sterile technique and 1% Lidocaine as local anesthetic, under
stereotactic guidance, a 9 gauge vacuum assisted device was used to
perform core needle biopsy of calcifications and associated
asymmetry lying 2 cm lateral and slightly inferior to the 12 o'clock
position mass, using a superior approach. Specimen radiograph was
performed showing several calcifications for which biopsy was
performed. Specimens with calcifications are identified for
pathology.

Lesion quadrant: Upper outer quadrant

At the conclusion of the procedure, an X shaped tissue marker clip
was deployed into the biopsy cavity.

Follow up 2 view mammogram was performed and dictated separately.
IMPRESSION: Ultrasound guided biopsy of a left breast mass.

Stereotactic guided biopsy left breast calcifications and asymmetry
2 cm lateral and slightly inferior to the left breast mass.

No apparent complications.

ADDENDUM:
1- Pathology revealed GRADE II INVASIVE MAMMARY CARCINOMA- MAMMARY
CARCINOMA IN SITU of the LEFT breast, 12 o'clock, 7cmfn (ribbon
clip). This was found to be concordant by Dr. Gvtiso Besprozvannii.

2- Pathology revealed GRADE II INVASIVE MAMMARY CARCINOMA of the
LEFT breast, upper outer quadrant (X clip). This was found to be
concordant by Dr. Gvtiso Besprozvannii.

Note:

1. and 2. E-cadherin is NEGATIVE supporting lobular origin.

2. There are microcalcifications associated with benign breast
tissue (invasive carcinoma) in the biopsy material.

Pathology results were discussed with the patient by telephone. The
patient reported doing well after the biopsies with tenderness at
the sites. Post biopsy instructions and care were reviewed and
questions were answered. The patient was encouraged to call The

The patient was referred to [REDACTED]
[REDACTED] at [REDACTED] on
April 23, 2021.

Pathology results reported by Nana-Chan Bohler RN on 04/15/2021.

*** End of Addendum ***
PROCEDURE:
BIOPSY #1: 3.9 CM 12 O'CLOCK POSITION BREAST MASS.

I met with the patient and we discussed the procedure of
ultrasound-guided biopsy, including benefits and alternatives. We
discussed the high likelihood of a successful procedure. We
discussed the risks of the procedure, including infection, bleeding,
tissue injury, clip migration, and inadequate sampling. Informed
written consent was given. The usual time-out protocol was performed
immediately prior to the procedure.

Lesion quadrant: Upper outer quadrant, near 12 o'clock, 4 cm the
nipple.

Using sterile technique and 1% Lidocaine as local anesthetic, under
direct ultrasound visualization, a 12 gauge Batai device was
used to perform biopsy of the 3.9 cm spiculated mass using an
inferior approach. At the conclusion of the procedure a ribbon
shaped tissue marker clip was deployed into the biopsy cavity.

BIOPSY #2: 1.3 CM SPAN CALCIFICATIONS AND ASSOCIATED ASYMMETRY.

The patient and I discussed the procedure of stereotactic-guided
biopsy including benefits and alternatives. We discussed the high
likelihood of a successful procedure. We discussed the risks of the
procedure including infection, bleeding, tissue injury, clip
migration, and inadequate sampling. Informed written consent was
given. The usual time out protocol was performed immediately prior
to the procedure.

Using sterile technique and 1% Lidocaine as local anesthetic, under
stereotactic guidance, a 9 gauge vacuum assisted device was used to
perform core needle biopsy of calcifications and associated
asymmetry lying 2 cm lateral and slightly inferior to the 12 o'clock
position mass, using a superior approach. Specimen radiograph was
performed showing several calcifications for which biopsy was
performed. Specimens with calcifications are identified for
pathology.

Lesion quadrant: Upper outer quadrant

At the conclusion of the procedure, an X shaped tissue marker clip
was deployed into the biopsy cavity.

Follow up 2 view mammogram was performed and dictated separately.
IMPRESSION: Ultrasound guided biopsy of a left breast mass.

Stereotactic guided biopsy left breast calcifications and asymmetry
2 cm lateral and slightly inferior to the left breast mass.

No apparent complications.
